# Patient Record
Sex: Male | Born: 1980
Health system: Southern US, Community
[De-identification: ages and names within clinical notes are randomized; demographics above are authoritative.]

---

## 2019-09-15 ENCOUNTER — Other Ambulatory Visit: Payer: Self-pay

## 2019-09-15 ENCOUNTER — Ambulatory Visit
Admission: EM | Admit: 2019-09-15 | Discharge: 2019-09-15 | Disposition: A | Discharge status: Home / Self Care | Payer: BC Managed Care – PPO | Attending: Family Medicine | Admitting: Family Medicine

## 2019-09-15 DIAGNOSIS — W503XXA Accidental bite by another person, initial encounter: Secondary | ICD-10-CM | POA: Diagnosis not present

## 2019-09-15 DIAGNOSIS — S61212A Laceration without foreign body of right middle finger without damage to nail, initial encounter: Secondary | ICD-10-CM

## 2019-09-15 DIAGNOSIS — S61259A Open bite of unspecified finger without damage to nail, initial encounter: Secondary | ICD-10-CM

## 2019-09-15 DIAGNOSIS — Z23 Encounter for immunization: Secondary | ICD-10-CM | POA: Diagnosis not present

## 2019-09-15 MED ORDER — TETANUS-DIPHTH-ACELL PERTUSSIS 5-2.5-18.5 LF-MCG/0.5 IM SUSP
0.5000 mL | Freq: Once | INTRAMUSCULAR | Status: AC
  Administered 2019-09-15: 13:00:00 0.5 mL via INTRAMUSCULAR

## 2019-09-15 MED ORDER — AMOXICILLIN-POT CLAVULANATE 875-125 MG PO TABS: 1.0000 | ORAL_TABLET | Freq: Two times a day (BID) | ORAL | 0 refills | Status: AC
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
## 2019-09-27 ENCOUNTER — Encounter: Payer: Self-pay | Admitting: Emergency Medicine

## 2019-09-27 ENCOUNTER — Ambulatory Visit
Admission: EM | Admit: 2019-09-27 | Discharge: 2019-09-27 | Disposition: A | Discharge status: Home / Self Care | Payer: BC Managed Care – PPO | Attending: Emergency Medicine | Admitting: Emergency Medicine

## 2019-09-27 ENCOUNTER — Other Ambulatory Visit: Payer: Self-pay

## 2019-09-27 DIAGNOSIS — W503XXD Accidental bite by another person, subsequent encounter: Secondary | ICD-10-CM

## 2019-09-27 DIAGNOSIS — S61259D Open bite of unspecified finger without damage to nail, subsequent encounter: Secondary | ICD-10-CM | POA: Diagnosis not present

## 2019-09-27 DIAGNOSIS — Z4800 Encounter for change or removal of nonsurgical wound dressing: Secondary | ICD-10-CM | POA: Diagnosis not present

## 2019-09-27 DIAGNOSIS — L03011 Cellulitis of right finger: Secondary | ICD-10-CM

## 2019-09-27 MED ORDER — DOXYCYCLINE HYCLATE 100 MG PO CAPS: 100.0000 mg | ORAL_CAPSULE | Freq: Two times a day (BID) | ORAL | 0 refills | Status: DC

## 2019-09-27 NOTE — ED Triage Notes (Signed)
Pt presents to UC for suture removal. He had 4 sutures placed on 09/15/19. Pt finger still has some swelling but no pain.
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
## 2019-09-27 NOTE — ED Notes (Signed)
Removed 3 sutures from pt wound. Carl Beavers, NP made aware that a 4th suture was not seen. Prompted to be seen by provider.

## 2019-09-27 NOTE — ED Notes (Signed)
Applied triple antibiotic ointment, non stick pad and coban to the left middle finger.

## 2019-09-27 NOTE — Discharge Instructions (Signed)
Take the doxycycline as directed.    Keep your wound clean and dry.  Wash it gently twice a day with soap and water.  Apply an antibiotic cream and bandage twice a day.    Return here if you see signs of worsening infection, such as increased pain, redness, pus-like drainage, warmth, fever, chills, or other concerning symptoms.    Follow up with the hand surgeon listed below in the next week for a wound recheck if your finger is not improving.  Call the number listed below to schedule an appointment.

## 2019-10-04 DIAGNOSIS — M40202 Unspecified kyphosis, cervical region: Secondary | ICD-10-CM | POA: Diagnosis not present

## 2019-10-04 DIAGNOSIS — M9902 Segmental and somatic dysfunction of thoracic region: Secondary | ICD-10-CM | POA: Diagnosis not present

## 2019-10-04 DIAGNOSIS — M9907 Segmental and somatic dysfunction of upper extremity: Secondary | ICD-10-CM | POA: Diagnosis not present

## 2019-10-04 DIAGNOSIS — M9901 Segmental and somatic dysfunction of cervical region: Secondary | ICD-10-CM | POA: Diagnosis not present

## 2019-10-11 DIAGNOSIS — M9907 Segmental and somatic dysfunction of upper extremity: Secondary | ICD-10-CM | POA: Diagnosis not present

## 2019-10-11 DIAGNOSIS — M9902 Segmental and somatic dysfunction of thoracic region: Secondary | ICD-10-CM | POA: Diagnosis not present

## 2019-10-11 DIAGNOSIS — M9901 Segmental and somatic dysfunction of cervical region: Secondary | ICD-10-CM | POA: Diagnosis not present

## 2019-10-11 DIAGNOSIS — M40202 Unspecified kyphosis, cervical region: Secondary | ICD-10-CM | POA: Diagnosis not present

## 2019-10-16 DIAGNOSIS — M9907 Segmental and somatic dysfunction of upper extremity: Secondary | ICD-10-CM | POA: Diagnosis not present

## 2019-10-16 DIAGNOSIS — M9901 Segmental and somatic dysfunction of cervical region: Secondary | ICD-10-CM | POA: Diagnosis not present

## 2019-10-16 DIAGNOSIS — M9902 Segmental and somatic dysfunction of thoracic region: Secondary | ICD-10-CM | POA: Diagnosis not present

## 2019-10-16 DIAGNOSIS — M40202 Unspecified kyphosis, cervical region: Secondary | ICD-10-CM | POA: Diagnosis not present

## 2019-10-18 DIAGNOSIS — M9901 Segmental and somatic dysfunction of cervical region: Secondary | ICD-10-CM | POA: Diagnosis not present

## 2019-10-18 DIAGNOSIS — M9907 Segmental and somatic dysfunction of upper extremity: Secondary | ICD-10-CM | POA: Diagnosis not present

## 2019-10-18 DIAGNOSIS — M9902 Segmental and somatic dysfunction of thoracic region: Secondary | ICD-10-CM | POA: Diagnosis not present

## 2019-10-18 DIAGNOSIS — M40202 Unspecified kyphosis, cervical region: Secondary | ICD-10-CM | POA: Diagnosis not present

## 2019-10-19 ENCOUNTER — Ambulatory Visit: Payer: BC Managed Care – PPO | Admitting: Plastic Surgery

## 2019-10-19 ENCOUNTER — Other Ambulatory Visit: Payer: Self-pay

## 2019-10-19 ENCOUNTER — Encounter: Payer: Self-pay | Admitting: Plastic Surgery

## 2019-10-19 VITALS — BP 140/82 | HR 59 | Temp 97.9°F | Ht 69.0 in | Wt 206.0 lb

## 2019-10-19 DIAGNOSIS — W503XXA Accidental bite by another person, initial encounter: Secondary | ICD-10-CM | POA: Diagnosis not present

## 2019-10-19 DIAGNOSIS — S61212A Laceration without foreign body of right middle finger without damage to nail, initial encounter: Secondary | ICD-10-CM | POA: Diagnosis not present
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
## 2019-10-25 DIAGNOSIS — M40202 Unspecified kyphosis, cervical region: Secondary | ICD-10-CM | POA: Diagnosis not present

## 2019-10-25 DIAGNOSIS — M9902 Segmental and somatic dysfunction of thoracic region: Secondary | ICD-10-CM | POA: Diagnosis not present

## 2019-10-25 DIAGNOSIS — M9901 Segmental and somatic dysfunction of cervical region: Secondary | ICD-10-CM | POA: Diagnosis not present

## 2019-10-25 DIAGNOSIS — M9907 Segmental and somatic dysfunction of upper extremity: Secondary | ICD-10-CM | POA: Diagnosis not present

## 2019-10-26 DIAGNOSIS — M40202 Unspecified kyphosis, cervical region: Secondary | ICD-10-CM | POA: Diagnosis not present

## 2019-10-26 DIAGNOSIS — M9902 Segmental and somatic dysfunction of thoracic region: Secondary | ICD-10-CM | POA: Diagnosis not present

## 2019-10-26 DIAGNOSIS — M9907 Segmental and somatic dysfunction of upper extremity: Secondary | ICD-10-CM | POA: Diagnosis not present

## 2019-10-26 DIAGNOSIS — M9901 Segmental and somatic dysfunction of cervical region: Secondary | ICD-10-CM | POA: Diagnosis not present

## 2019-10-30 DIAGNOSIS — M40202 Unspecified kyphosis, cervical region: Secondary | ICD-10-CM | POA: Diagnosis not present

## 2019-10-30 DIAGNOSIS — M9902 Segmental and somatic dysfunction of thoracic region: Secondary | ICD-10-CM | POA: Diagnosis not present

## 2019-10-30 DIAGNOSIS — M9907 Segmental and somatic dysfunction of upper extremity: Secondary | ICD-10-CM | POA: Diagnosis not present

## 2019-10-30 DIAGNOSIS — M9901 Segmental and somatic dysfunction of cervical region: Secondary | ICD-10-CM | POA: Diagnosis not present

## 2019-11-01 DIAGNOSIS — M9902 Segmental and somatic dysfunction of thoracic region: Secondary | ICD-10-CM | POA: Diagnosis not present

## 2019-11-01 DIAGNOSIS — M9901 Segmental and somatic dysfunction of cervical region: Secondary | ICD-10-CM | POA: Diagnosis not present

## 2019-11-01 DIAGNOSIS — M40202 Unspecified kyphosis, cervical region: Secondary | ICD-10-CM | POA: Diagnosis not present

## 2019-11-01 DIAGNOSIS — M9907 Segmental and somatic dysfunction of upper extremity: Secondary | ICD-10-CM | POA: Diagnosis not present

## 2019-11-02 ENCOUNTER — Ambulatory Visit: Payer: BC Managed Care – PPO | Admitting: Plastic Surgery

## 2019-11-02 DIAGNOSIS — M9907 Segmental and somatic dysfunction of upper extremity: Secondary | ICD-10-CM | POA: Diagnosis not present

## 2019-11-02 DIAGNOSIS — M9901 Segmental and somatic dysfunction of cervical region: Secondary | ICD-10-CM | POA: Diagnosis not present

## 2019-11-02 DIAGNOSIS — M9902 Segmental and somatic dysfunction of thoracic region: Secondary | ICD-10-CM | POA: Diagnosis not present

## 2019-11-02 DIAGNOSIS — M40202 Unspecified kyphosis, cervical region: Secondary | ICD-10-CM | POA: Diagnosis not present

## 2019-11-06 DIAGNOSIS — M9901 Segmental and somatic dysfunction of cervical region: Secondary | ICD-10-CM | POA: Diagnosis not present

## 2019-11-06 DIAGNOSIS — M40202 Unspecified kyphosis, cervical region: Secondary | ICD-10-CM | POA: Diagnosis not present

## 2019-11-06 DIAGNOSIS — M9907 Segmental and somatic dysfunction of upper extremity: Secondary | ICD-10-CM | POA: Diagnosis not present

## 2019-11-06 DIAGNOSIS — M9902 Segmental and somatic dysfunction of thoracic region: Secondary | ICD-10-CM | POA: Diagnosis not present

## 2019-11-09 DIAGNOSIS — M40202 Unspecified kyphosis, cervical region: Secondary | ICD-10-CM | POA: Diagnosis not present

## 2019-11-09 DIAGNOSIS — M9907 Segmental and somatic dysfunction of upper extremity: Secondary | ICD-10-CM | POA: Diagnosis not present

## 2019-11-09 DIAGNOSIS — M9902 Segmental and somatic dysfunction of thoracic region: Secondary | ICD-10-CM | POA: Diagnosis not present

## 2019-11-09 DIAGNOSIS — M9901 Segmental and somatic dysfunction of cervical region: Secondary | ICD-10-CM | POA: Diagnosis not present

## 2019-11-13 DIAGNOSIS — M9907 Segmental and somatic dysfunction of upper extremity: Secondary | ICD-10-CM | POA: Diagnosis not present

## 2019-11-13 DIAGNOSIS — M40202 Unspecified kyphosis, cervical region: Secondary | ICD-10-CM | POA: Diagnosis not present

## 2019-11-13 DIAGNOSIS — M9901 Segmental and somatic dysfunction of cervical region: Secondary | ICD-10-CM | POA: Diagnosis not present

## 2019-11-13 DIAGNOSIS — M9902 Segmental and somatic dysfunction of thoracic region: Secondary | ICD-10-CM | POA: Diagnosis not present

## 2019-11-16 DIAGNOSIS — M9907 Segmental and somatic dysfunction of upper extremity: Secondary | ICD-10-CM | POA: Diagnosis not present

## 2019-11-16 DIAGNOSIS — M9901 Segmental and somatic dysfunction of cervical region: Secondary | ICD-10-CM | POA: Diagnosis not present

## 2019-11-16 DIAGNOSIS — M40202 Unspecified kyphosis, cervical region: Secondary | ICD-10-CM | POA: Diagnosis not present

## 2019-11-16 DIAGNOSIS — M9902 Segmental and somatic dysfunction of thoracic region: Secondary | ICD-10-CM | POA: Diagnosis not present

## 2019-11-23 DIAGNOSIS — M9901 Segmental and somatic dysfunction of cervical region: Secondary | ICD-10-CM | POA: Diagnosis not present

## 2019-11-23 DIAGNOSIS — M40202 Unspecified kyphosis, cervical region: Secondary | ICD-10-CM | POA: Diagnosis not present

## 2019-11-23 DIAGNOSIS — M9907 Segmental and somatic dysfunction of upper extremity: Secondary | ICD-10-CM | POA: Diagnosis not present

## 2019-11-23 DIAGNOSIS — M9902 Segmental and somatic dysfunction of thoracic region: Secondary | ICD-10-CM | POA: Diagnosis not present

## 2019-11-27 DIAGNOSIS — M40202 Unspecified kyphosis, cervical region: Secondary | ICD-10-CM | POA: Diagnosis not present

## 2019-11-27 DIAGNOSIS — M9902 Segmental and somatic dysfunction of thoracic region: Secondary | ICD-10-CM | POA: Diagnosis not present

## 2019-11-27 DIAGNOSIS — M9907 Segmental and somatic dysfunction of upper extremity: Secondary | ICD-10-CM | POA: Diagnosis not present

## 2019-11-27 DIAGNOSIS — M9901 Segmental and somatic dysfunction of cervical region: Secondary | ICD-10-CM | POA: Diagnosis not present

## 2019-11-29 DIAGNOSIS — M9907 Segmental and somatic dysfunction of upper extremity: Secondary | ICD-10-CM | POA: Diagnosis not present

## 2019-11-29 DIAGNOSIS — M9902 Segmental and somatic dysfunction of thoracic region: Secondary | ICD-10-CM | POA: Diagnosis not present

## 2019-11-29 DIAGNOSIS — M40202 Unspecified kyphosis, cervical region: Secondary | ICD-10-CM | POA: Diagnosis not present

## 2019-11-29 DIAGNOSIS — M9901 Segmental and somatic dysfunction of cervical region: Secondary | ICD-10-CM | POA: Diagnosis not present

## 2019-11-30 DIAGNOSIS — M40202 Unspecified kyphosis, cervical region: Secondary | ICD-10-CM | POA: Diagnosis not present

## 2019-11-30 DIAGNOSIS — M9902 Segmental and somatic dysfunction of thoracic region: Secondary | ICD-10-CM | POA: Diagnosis not present

## 2019-11-30 DIAGNOSIS — M9907 Segmental and somatic dysfunction of upper extremity: Secondary | ICD-10-CM | POA: Diagnosis not present

## 2019-11-30 DIAGNOSIS — M9901 Segmental and somatic dysfunction of cervical region: Secondary | ICD-10-CM | POA: Diagnosis not present

## 2019-12-04 DIAGNOSIS — M9902 Segmental and somatic dysfunction of thoracic region: Secondary | ICD-10-CM | POA: Diagnosis not present

## 2019-12-04 DIAGNOSIS — M9901 Segmental and somatic dysfunction of cervical region: Secondary | ICD-10-CM | POA: Diagnosis not present

## 2019-12-04 DIAGNOSIS — M9907 Segmental and somatic dysfunction of upper extremity: Secondary | ICD-10-CM | POA: Diagnosis not present

## 2019-12-04 DIAGNOSIS — M40202 Unspecified kyphosis, cervical region: Secondary | ICD-10-CM | POA: Diagnosis not present

## 2019-12-07 DIAGNOSIS — M9902 Segmental and somatic dysfunction of thoracic region: Secondary | ICD-10-CM | POA: Diagnosis not present

## 2019-12-07 DIAGNOSIS — M40202 Unspecified kyphosis, cervical region: Secondary | ICD-10-CM | POA: Diagnosis not present

## 2019-12-07 DIAGNOSIS — M9901 Segmental and somatic dysfunction of cervical region: Secondary | ICD-10-CM | POA: Diagnosis not present

## 2019-12-07 DIAGNOSIS — M9907 Segmental and somatic dysfunction of upper extremity: Secondary | ICD-10-CM | POA: Diagnosis not present

## 2019-12-21 DIAGNOSIS — M9902 Segmental and somatic dysfunction of thoracic region: Secondary | ICD-10-CM | POA: Diagnosis not present

## 2019-12-21 DIAGNOSIS — M9907 Segmental and somatic dysfunction of upper extremity: Secondary | ICD-10-CM | POA: Diagnosis not present

## 2019-12-21 DIAGNOSIS — M40202 Unspecified kyphosis, cervical region: Secondary | ICD-10-CM | POA: Diagnosis not present

## 2019-12-21 DIAGNOSIS — M9901 Segmental and somatic dysfunction of cervical region: Secondary | ICD-10-CM | POA: Diagnosis not present

## 2020-03-11 ENCOUNTER — Encounter: Payer: Self-pay | Admitting: Emergency Medicine

## 2020-03-11 ENCOUNTER — Ambulatory Visit
Admission: EM | Admit: 2020-03-11 | Discharge: 2020-03-11 | Disposition: A | Discharge status: Home / Self Care | Payer: Worker's Compensation | Attending: Family Medicine | Admitting: Family Medicine

## 2020-03-11 ENCOUNTER — Other Ambulatory Visit: Payer: Self-pay

## 2020-03-11 DIAGNOSIS — S81812A Laceration without foreign body, left lower leg, initial encounter: Secondary | ICD-10-CM

## 2020-03-11 NOTE — ED Triage Notes (Signed)
Pt presents to [facility] for assessment of laceration to shin of left leg, occurring this evening at work, states he walked into a corner of a machine.

## 2020-03-11 NOTE — Discharge Instructions (Addendum)
Return in 10-14 days for removal Watch for signs of infection Keep clean and dry Antibiotic ointment for a few days and then to air  Follow up as needed for continued or worsening symptoms

## 2020-03-12 DIAGNOSIS — S81812A Laceration without foreign body, left lower leg, initial encounter: Secondary | ICD-10-CM

## 2020-03-12 NOTE — ED Provider Notes (Addendum)
[facility]    CSN: [id number] Arrival date & time: 03/11/20  1948      History   Chief Complaint Chief Complaint  Patient presents with  . Laceration    HPI Carl Berg is a 39 y.o. male.   Patient is a 39 year old male presents today for laceration to left lower extremity.  Located to shin.  This occurred this evening prior to arrival at work.  Reporting he walked into the corner of a metal machine.  Bleeding is controlled.     History reviewed. No pertinent past medical history.  There are no problems to display for this patient.   History reviewed. No pertinent surgical history.     Home Medications    Prior to Admission medications   Not on File    Family History History reviewed. No pertinent family history.  Social History Social History   Tobacco Use  . Smoking status: Never Smoker  . Smokeless tobacco: Never Used  Vaping Use  . Vaping Use: Never used  Substance Use Topics  . Alcohol use: Yes    Comment: occasionally  . Drug use: Never     Allergies   Patient has no known allergies.   Review of Systems Review of Systems   Physical Exam Triage Vital Signs ED Triage Vitals [03/11/20 1959]  Enc Vitals Group     BP      Pulse      Resp      Temp 98.5 F (36.9 C)     Temp Source Oral     SpO2      Weight      Height      Head Circumference      Peak Flow      Pain Score 0     Pain Loc      Pain Edu?      Excl. in [place]?    No data found.  Updated Vital Signs Temp 98.5 F (36.9 C) (Oral)   Visual Acuity Right Eye Distance:   Left Eye Distance:   Bilateral Distance:    Right Eye Near:   Left Eye Near:    Bilateral Near:     Physical Exam Vitals and nursing note reviewed.  Constitutional:      Appearance: Normal appearance.  HENT:     Head: Normocephalic and atraumatic.     Nose: Nose normal.  Eyes:     Conjunctiva/sclera: Conjunctivae normal.  Pulmonary:     Effort: Pulmonary effort is normal.   Musculoskeletal:        General: Normal range of motion.     Cervical back: Normal range of motion.  Skin:    General: Skin is warm and dry.          Comments: Approximated 5 cm laceration to left lower extremity, bleeding controlled.  Neurological:     Mental Status: He is alert.  Psychiatric:        Mood and Affect: Mood normal.      UC Treatments / Results  Labs (all labs ordered are listed, but only abnormal results are displayed) Labs Reviewed - No data to display  EKG   Radiology No results found.  Procedures Laceration Repair  Date/Time: 03/12/2020 10:23 AM Performed by: Janace Aris, NP Authorized by: Janace Aris, NP   Consent:    Consent obtained:  Verbal   Consent given by:  Patient   Risks discussed:  Infection, need for additional repair, pain, poor  cosmetic result and poor wound healing   Alternatives discussed:  No treatment and delayed treatment Universal protocol:    Patient identity confirmed:  Verbally with patient Anesthesia (see MAR for exact dosages):    Anesthesia method:  Local infiltration   Local anesthetic:  Lidocaine 2% WITH epi Laceration details:    Location:  Leg   Leg location:  L lower leg   Length (cm):  5 Repair type:    Repair type:  Intermediate Exploration:    Hemostasis achieved with:  Direct pressure   Wound extent: no foreign bodies/material noted, no muscle damage noted, no nerve damage noted, no tendon damage noted and no underlying fracture noted     Contaminated: no   Treatment:    Area cleansed with:  Saline and soap and water   Amount of cleaning:  Standard   Irrigation method:  Pressure wash   Visualized foreign bodies/material removed: no   Skin repair:    Repair method:  Sutures   Suture size:  3-0   Suture material:  Prolene   Suture technique:  Simple interrupted   Number of sutures:  7 Approximation:    Approximation:  Close Post-procedure details:    Dressing:  Antibiotic ointment and bulky  dressing   Patient tolerance of procedure:  Tolerated well, no immediate complications   (including critical care time)  Medications Ordered in UC Medications - No data to display  Initial Impression / Assessment and Plan / UC Course  I have reviewed the triage vital signs and the nursing notes.  Pertinent labs & imaging results that were available during my care of the patient were reviewed by me and considered in my medical decision making (see chart for details).     Laceration to left lower leg Sutured here in clinic Patient's tetanus is already updated.  No need to reupdate. Placed antibiotic ointment and wrapped Recommended return in 10 to 14 days for removal. Recommended watch for signs of infection and keep clean and dry. Follow up as needed for continued or worsening symptoms  Final Clinical Impressions(s) / UC Diagnoses   Final diagnoses:  Laceration of left lower leg, initial encounter     Discharge Instructions     Return in 10-14 days for removal Watch for signs of infection Keep clean and dry Antibiotic ointment for a few days and then to air  Follow up as needed for continued or worsening symptoms     ED Prescriptions    None     PDMP not reviewed this encounter.   Janace Aris, NP 03/12/20 1027    Dahlia Byes A, NP 03/12/20 1029

## 2020-05-08 ENCOUNTER — Other Ambulatory Visit: Payer: Self-pay

## 2020-05-08 ENCOUNTER — Ambulatory Visit: Payer: BC Managed Care – PPO | Admitting: Physician Assistant

## 2020-05-08 VITALS — BP 138/74 | HR 84 | Temp 100.0°F | Resp 18 | Ht 69.0 in | Wt 200.0 lb

## 2020-05-08 DIAGNOSIS — Z20822 Contact with and (suspected) exposure to covid-19: Secondary | ICD-10-CM

## 2020-05-08 LAB — POC COVID19 BINAXNOW: SARS Coronavirus 2 Ag: NEGATIVE

## 2020-05-08 NOTE — Progress Notes (Signed)
New Patient Office Visit  Subjective:  Patient ID: Carl Berg, male    DOB: Sep 30, 1980  Age: 39 y.o. MRN: [id number]  CC:  Chief Complaint  Patient presents with  . Covid Exposure    HPI Carl Berg states that he started feeling poorly last night, becoming worse today states that he has not measured his temperature but has felt hot on and off.  Endorses chills, body aches, fatigue, sore throat. Has not tried anything for relief.  No sick contacts.  Endorses [employer] Covid vaccine in March 2021.  History reviewed. No pertinent past medical history.  History reviewed. No pertinent surgical history.  History reviewed. No pertinent family history.  Social History   Socioeconomic History  . Marital status: Single    Spouse name: Not on file  . Number of children: Not on file  . Years of education: Not on file  . Highest education level: Not on file  Occupational History  . Not on file  Tobacco Use  . Smoking status: Never Smoker  . Smokeless tobacco: Never Used  Vaping Use  . Vaping Use: Never used  Substance and Sexual Activity  . Alcohol use: Yes    Comment: occasionally  . Drug use: Never  . Sexual activity: Not on file  Other Topics Concern  . Not on file  Social History Narrative  . Not on file   Social Determinants of Health   Financial Resource Strain:   . Difficulty of Paying Living Expenses: Not on file  Food Insecurity:   . Worried About Programme researcher, broadcasting/film/video in the Last Year: Not on file  . Ran Out of Food in the Last Year: Not on file  Transportation Needs:   . Lack of Transportation (Medical): Not on file  . Lack of Transportation (Non-Medical): Not on file  Physical Activity:   . Days of Exercise per Week: Not on file  . Minutes of Exercise per Session: Not on file  Stress:   . Feeling of Stress : Not on file  Social Connections:   . Frequency of Communication with Friends and Family: Not on file  . Frequency of Social  Gatherings with Friends and Family: Not on file  . Attends Religious Services: Not on file  . Active Member of Clubs or Organizations: Not on file  . Attends Banker Meetings: Not on file  . Marital Status: Not on file  Intimate Partner Violence:   . Fear of Current or Ex-Partner: Not on file  . Emotionally Abused: Not on file  . Physically Abused: Not on file  . Sexually Abused: Not on file    ROS Review of Systems  Constitutional: Positive for chills, fatigue and fever.  HENT: Positive for sore throat. Negative for congestion, ear pain, postnasal drip, rhinorrhea and sinus pressure.   Respiratory: Negative for cough, shortness of breath and wheezing.   Cardiovascular: Negative for chest pain.  Gastrointestinal: Negative for diarrhea, nausea and vomiting.  Endocrine: Negative.   Genitourinary: Negative.   Musculoskeletal: Positive for myalgias.  Skin: Negative.   Allergic/Immunologic: Negative.   Neurological: Positive for headaches.  Hematological: Negative.   Psychiatric/Behavioral: Negative.     Objective:   Today's Vitals: BP 138/74 (BP Location: Left Arm, Patient Position: Sitting, Cuff Size: Large)   Pulse 84   Temp 100 F (37.8 C) (Oral)   Resp 18   Ht 5\' 9"  (1.753 m)   Wt 200 lb (90.7 kg)   SpO2 100%  BMI 29.53 kg/m   Physical Exam Vitals and nursing note reviewed.  Constitutional:      General: He is not in acute distress.    Appearance: Normal appearance. He is normal weight. He is not ill-appearing.  HENT:     Head: Normocephalic and atraumatic.     Right Ear: Tympanic membrane, ear canal and external ear normal.     Left Ear: Tympanic membrane, ear canal and external ear normal.     Nose: Nose normal. No congestion or rhinorrhea.     Mouth/Throat:     Mouth: Mucous membranes are moist.     Pharynx: Oropharynx is clear.  Eyes:     Extraocular Movements: Extraocular movements intact.     Conjunctiva/sclera: Conjunctivae normal.      Pupils: Pupils are equal, round, and reactive to light.  Cardiovascular:     Rate and Rhythm: Normal rate and regular rhythm.     Pulses: Normal pulses.     Heart sounds: Normal heart sounds.  Pulmonary:     Effort: Pulmonary effort is normal.     Breath sounds: Normal breath sounds. No wheezing.  Abdominal:     General: Abdomen is flat.     Palpations: Abdomen is soft.  Musculoskeletal:        General: Normal range of motion.     Cervical back: Normal range of motion and neck supple.  Lymphadenopathy:     Cervical: No cervical adenopathy.  Skin:    General: Skin is warm and dry.  Neurological:     General: No focal deficit present.     Mental Status: He is alert and oriented to person, place, and time.  Psychiatric:        Mood and Affect: Mood normal.        Behavior: Behavior normal.        Thought Content: Thought content normal.        Judgment: Judgment normal.     Assessment & Plan:   Problem List Items Addressed This Visit    None    Visit Diagnoses    Suspected 2019 novel coronavirus infection    -  Primary   Relevant Orders   POC COVID-19 (Completed)   Novel Coronavirus, NAA ([facility])      No outpatient encounter medications on file as of 05/08/2020.   No facility-administered encounter medications on file as of 05/08/2020.  1. Suspected 2019 novel coronavirus infection  Rapid testing negative. Patient evaluated, tested and sent home with instructions for home care and Quarantine. Instructed to seek further care if symptoms worsen.  Is set up with follow up for a virtual visit with PCP in next 24-48 hours.   - POC COVID-19 - Novel Coronavirus, NAA ([facility])   I have reviewed the patient's medical history (PMH, PSH, Social History, Family History, Medications, and allergies) , and have been updated if relevant. I spent 30 minutes reviewing chart and  face to face time with patient.    Follow-up: Return if symptoms worsen or fail to improve.   Kasandra Knudsen  Mayers, PA-C

## 2020-05-08 NOTE — Patient Instructions (Signed)
Please make sure to login to MyChart so you can see your test results as soon as they are ready.  Get lots of rest and plenty of hydration.  You can use over the counter cold and pain relief products for your symptoms.  I hope that you feel better soon  Roney Jaffe, PA-C Physician Assistant [facility] [email or web address]   COVID-19: Quarantine vs. Isolation QUARANTINE keeps someone who was in close contact with someone who has COVID-19 away from others. If you had close contact with a person who has COVID-19  Stay home until 14 days after your last contact.  Check your temperature twice a day and watch for symptoms of COVID-19.  If possible, stay away from people who are at higher-risk for getting very sick from COVID-19. ISOLATION keeps someone who is sick or tested positive for COVID-19 without symptoms away from others, even in their own home. If you are sick and think or know you have COVID-19  Stay home until after ? At least 10 days since symptoms first appeared and ? At least 24 hours with no fever without fever-reducing medication and ? Symptoms have improved If you tested positive for COVID-19 but do not have symptoms  Stay home until after ? 10 days have passed since your positive test If you live with others, stay in a specific "sick room" or area and away from other people or animals, including pets. Use a separate bathroom, if available. [email or web address] 03/06/2019 This information is not intended to replace advice given to you by your health care provider. Make sure you discuss any questions you have with your health care provider. Document Revised: 07/20/2019 Document Reviewed: 07/20/2019 Elsevier Patient Education  2020 Elsevier Inc.  COVID-19: What Your Test Results Mean If you test positive for COVID-19 Take steps to help prevent the spread of COVID-19 Stay home.  Do not leave your home, except  to get medical care. Do not visit public areas. Get rest and stay hydrated. Take over-the-counter medicines, such as acetaminophen, to help you feel better. Stay in touch with your doctor. Separate yourself from other people.  As much as possible, stay in a specific room and away from other people and pets in your home. If you test negative for COVID-19  You probably were not infected at the time your sample was collected.  However, that does not mean you will not get sick.  It is possible that you were very early in your infection when your sample was collected and that you could test positive later. A negative test result does not mean you won't get sick later. [email or web address] 01/14/2019 This information is not intended to replace advice given to you by your health care provider. Make sure you discuss any questions you have with your health care provider. Document Revised: 07/20/2019 Document Reviewed: 07/20/2019 Elsevier Patient Education  2020 [employer].

## 2020-05-08 NOTE — Progress Notes (Signed)
Patient complains of URI symptoms beginning today. Patient denies any known exposure to COVID. Patient has been vaccinated with J&J in march. Patient has eaten today. Patient denies pain at this time.

## 2020-05-10 ENCOUNTER — Encounter: Payer: Self-pay | Admitting: Physician Assistant

## 2020-05-10 LAB — NOVEL CORONAVIRUS, NAA: SARS-CoV-2, NAA: DETECTED — AB

## 2020-05-10 LAB — SARS-COV-2, NAA 2 DAY TAT

## 2020-05-17 DIAGNOSIS — Z20828 Contact with and (suspected) exposure to other viral communicable diseases: Secondary | ICD-10-CM | POA: Diagnosis not present

## 2020-07-18 DIAGNOSIS — M9902 Segmental and somatic dysfunction of thoracic region: Secondary | ICD-10-CM | POA: Diagnosis not present

## 2020-07-18 DIAGNOSIS — M9901 Segmental and somatic dysfunction of cervical region: Secondary | ICD-10-CM | POA: Diagnosis not present

## 2020-07-18 DIAGNOSIS — M40202 Unspecified kyphosis, cervical region: Secondary | ICD-10-CM | POA: Diagnosis not present

## 2020-07-18 DIAGNOSIS — M9907 Segmental and somatic dysfunction of upper extremity: Secondary | ICD-10-CM | POA: Diagnosis not present

## 2020-08-21 ENCOUNTER — Ambulatory Visit: Payer: BC Managed Care – PPO | Admitting: Internal Medicine

## 2020-08-29 ENCOUNTER — Encounter: Payer: Self-pay | Admitting: Internal Medicine

## 2020-08-29 ENCOUNTER — Other Ambulatory Visit: Payer: Self-pay

## 2020-08-29 ENCOUNTER — Telehealth (INDEPENDENT_AMBULATORY_CARE_PROVIDER_SITE_OTHER): Payer: BC Managed Care – PPO | Admitting: Internal Medicine

## 2020-08-29 VITALS — Ht 69.0 in | Wt 204.0 lb

## 2020-08-29 DIAGNOSIS — Z7689 Persons encountering health services in other specified circumstances: Secondary | ICD-10-CM | POA: Diagnosis not present

## 2020-08-29 DIAGNOSIS — Z7189 Other specified counseling: Secondary | ICD-10-CM | POA: Diagnosis not present

## 2020-08-29 NOTE — Progress Notes (Signed)
Virtual Visit via Telephone Note  I connected with Carl Berg, on 08/29/2020 at 9:38 AM by telephone due to the COVID-19 pandemic and verified that I am speaking with the correct person using two identifiers.   Consent: I discussed the limitations, risks, security and privacy concerns of performing an evaluation and management service by telephone and the availability of in person appointments. I also discussed with the patient that there may be a patient responsible charge related to this service. The patient expressed understanding and agreed to proceed.   Location of Patient: Home   Location of Provider: Clinic    Persons participating in Telemedicine visit: Darly Farah Benish Dr. Earlene Plater      History of Present Illness: Patient has a visit to establish care. Has been several years since saw a primary care physician. No PMH. No surgical history. Not currently taking any medications.   Received J&J COVID vaccination in March. Has not yet received booster. Reports never gets flu vaccine.   No past medical history on file. No Known Allergies  No current outpatient medications on file prior to visit.   No current facility-administered medications on file prior to visit.    Observations/Objective: NAD. Speaking clearly.  Work of breathing normal.  Alert and oriented. Mood appropriate.   Assessment and Plan: 1. Encounter to establish care Reviewed patient's PMH, social history, surgical history, and medications.  Is overdue for annual exam, screening blood work, and health maintenance topics. Have asked patient to return for visit to address these items.   2. Counseled about COVID-19 virus infection Encouraged patient to receive booster vaccine as is eligible. Discussed that seeing higher rates of infection amongst those who have not been boosted and more severe illness. Continue to adhere to the 3W's.     Follow Up Instructions: Annual exam    I  discussed the assessment and treatment plan with the patient. The patient was provided an opportunity to ask questions and all were answered. The patient agreed with the plan and demonstrated an understanding of the instructions.   The patient was advised to call back or seek an in-person evaluation if the symptoms worsen or if the condition fails to improve as anticipated.     I provided 7 minutes total of non-face-to-face time during this encounter including median intraservice time, reviewing previous notes, investigations, ordering medications, medical decision making, coordinating care and patient verbalized understanding at the end of the visit.    Marcy Siren, D.O. Primary Care at [facility]  08/29/2020, 9:38 AM

## 2020-09-16 ENCOUNTER — Telehealth: Payer: Self-pay | Admitting: *Deleted

## 2020-09-16 NOTE — Telephone Encounter (Signed)
"  I'm calling to ask some questions about your services."   I'm returning your call.  How can I help you?  "Do you all treat achilles?"  Yes, we treat problems with Achilles.  "Can I make an appointment?"  We can get you in tomorrow with Dr. Logan Bores.  "That will be great."

## 2020-09-17 ENCOUNTER — Ambulatory Visit: Payer: BC Managed Care – PPO | Admitting: Podiatry

## 2020-09-17 ENCOUNTER — Ambulatory Visit (INDEPENDENT_AMBULATORY_CARE_PROVIDER_SITE_OTHER): Payer: BC Managed Care – PPO

## 2020-09-17 ENCOUNTER — Encounter (INDEPENDENT_AMBULATORY_CARE_PROVIDER_SITE_OTHER): Payer: Self-pay

## 2020-09-17 ENCOUNTER — Other Ambulatory Visit: Payer: Self-pay

## 2020-09-17 DIAGNOSIS — M7661 Achilles tendinitis, right leg: Secondary | ICD-10-CM

## 2020-09-17 DIAGNOSIS — M7660 Achilles tendinitis, unspecified leg: Secondary | ICD-10-CM

## 2020-09-17 DIAGNOSIS — S86011A Strain of right Achilles tendon, initial encounter: Secondary | ICD-10-CM

## 2020-09-17 NOTE — Progress Notes (Signed)
   HPI: 40 y.o. male presenting today as a new patient for evaluation of pain and tenderness to the Achilles tendon of the right lower extremity.  Patient states that he was playing basketball approximately 6-7 weeks ago when he took a misstep and heard 2 distinct popping noises to the posterior ankle.  He has had pain and tenderness ever since.  He states that currently it is more of a nagging pain.  He has not done anything for treatment.  He presents for further treatment evaluation  No past medical history on file.   Physical Exam: General: The patient is alert and oriented x3 in no acute distress.  Dermatology: Skin is warm, dry and supple bilateral lower extremities. Negative for open lesions or macerations.  Vascular: Palpable pedal pulses bilaterally. No edema or erythema noted. Capillary refill within normal limits.  Neurological: Epicritic and protective threshold grossly intact bilaterally.   Musculoskeletal Exam: Range of motion within normal limits to all pedal and ankle joints bilateral. Muscle strength 5/5 in all groups bilateral.  Pain on palpation along the Achilles tendon right lower extremity.  There is also pain with forced plantar flexion against resistance.  Radiographic Exam:  Normal osseous mineralization. Joint spaces preserved. No fracture/dislocation/boney destruction.    Assessment: 1.  Achilles tendon injury right 2.  Achilles tendinitis right   Plan of Care:  1. Patient evaluated. X-Rays reviewed.  2.  Today were going to order an MRI right ankle to rule out Achilles tendon tear.  Patient does have a known history of trauma while playing basketball and heard an audible pop with significant pain to the area. 3.  Cam boot dispensed.  Weightbearing as tolerated 4.  Patient declined any oral anti-inflammatory  5.  Return to clinic after MRI to review results  *Works at [employer], Teacher, adult education for cars, furniture, etc      Felecia Shelling,  DPM [facility]  Dr. Felecia Shelling, DPM    [address] [place], [place] [address]                Office [phone]  Fax [phone]

## 2020-09-27 ENCOUNTER — Other Ambulatory Visit: Payer: Self-pay

## 2020-09-27 ENCOUNTER — Ambulatory Visit
Admission: RE | Admit: 2020-09-27 | Discharge: 2020-09-27 | Disposition: A | Discharge status: Home / Self Care | Payer: BC Managed Care – PPO | Source: Ambulatory Visit | Attending: Podiatry | Admitting: Podiatry

## 2020-09-27 DIAGNOSIS — S99911A Unspecified injury of right ankle, initial encounter: Secondary | ICD-10-CM | POA: Diagnosis not present

## 2020-09-27 DIAGNOSIS — M7660 Achilles tendinitis, unspecified leg: Secondary | ICD-10-CM

## 2020-09-27 DIAGNOSIS — S86011A Strain of right Achilles tendon, initial encounter: Secondary | ICD-10-CM

## 2020-09-27 IMAGING — MR MR ANKLE*R* W/O CM
5 series · 40 of 40 positions shown · non-contrast
Comparison: Right foot x-rays dated [DATE].

CLINICAL DATA: Right ankle injury playing basketball 2 weeks ago.

EXAM:
MRI OF THE RIGHT ANKLE WITHOUT CONTRAST
TECHNIQUE: Multiplanar, multisequence MR imaging of the ankle was performed. No
intravenous contrast was administered.

[Series 4: T1 · axial · 3.0mm · 0.66mm/px · z∈[-103,+49]mm · 10 of 40 slices shown (1 of 2)]
[im 1/40]
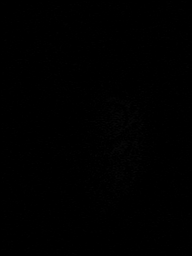
[im 5/40]
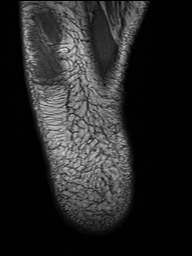
[im 9/40]
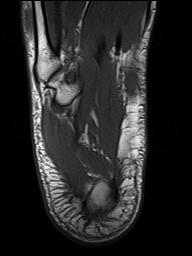
[im 14/40]
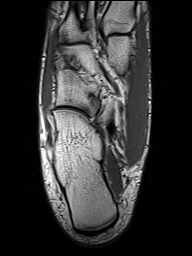
[im 18/40]
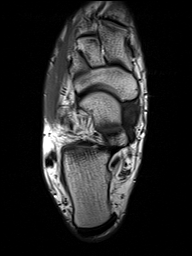
[im 22/40]
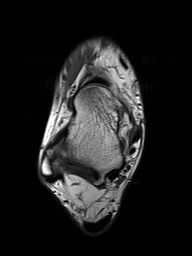
[im 27/40]
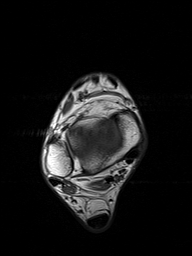
[im 31/40]
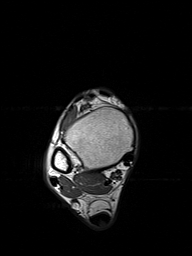
[im 35/40]
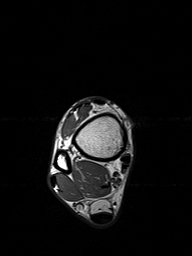
[im 40/40]
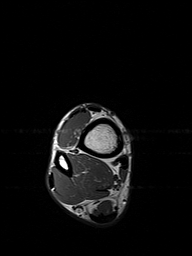

[Series 5: T2 fat-sat · axial · 3.0mm · 0.66mm/px · z∈[-104,+48]mm · 10 of 40 slices shown (1 of 2)]
[im 1/40]
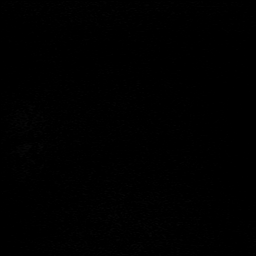
[im 5/40]
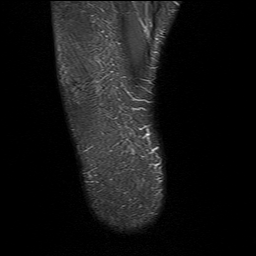
[im 9/40]
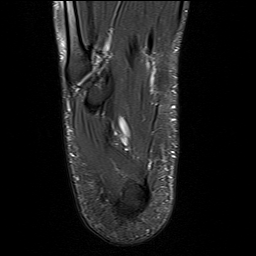
[im 14/40]
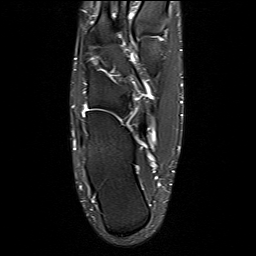
[im 18/40]
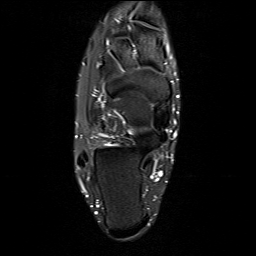
[im 22/40]
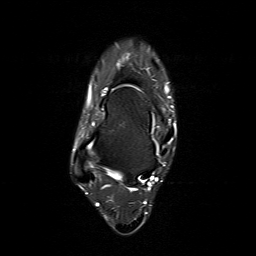
[im 27/40]
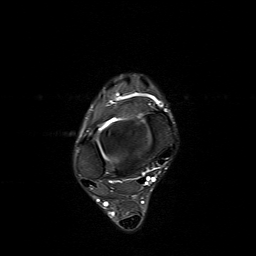
[im 31/40]
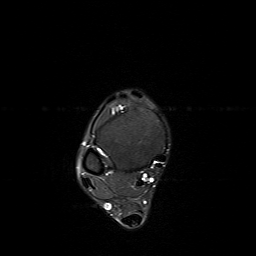
[im 35/40]
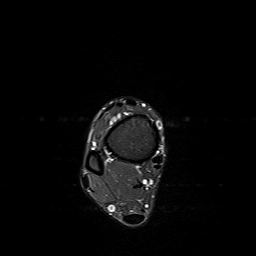
[im 40/40]
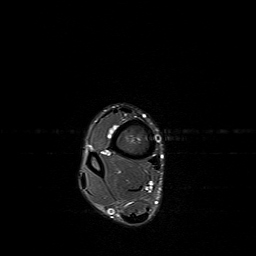

[Series 6: T1 · sagittal · 4.0mm · 0.70mm/px · 5 of 23 slices shown (2 of 2)]
[im 1/23]
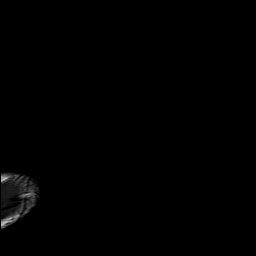
[im 6/23]
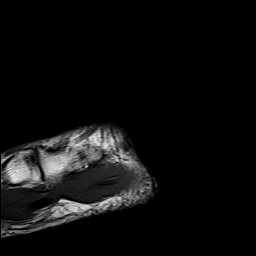
[im 12/23]
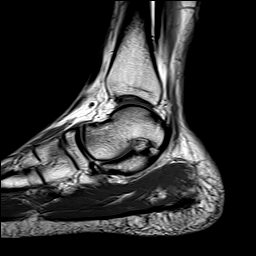
[im 17/23]
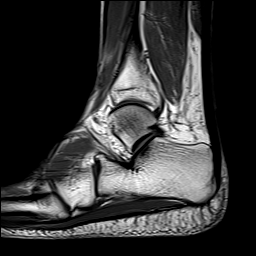
[im 23/23]
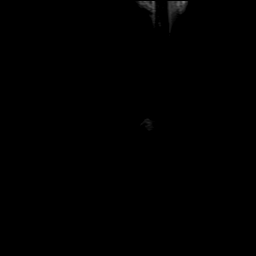

[Series 7: STIR · sagittal · 4.0mm · 0.35mm/px · 5 of 23 slices shown]
[im 1/23]
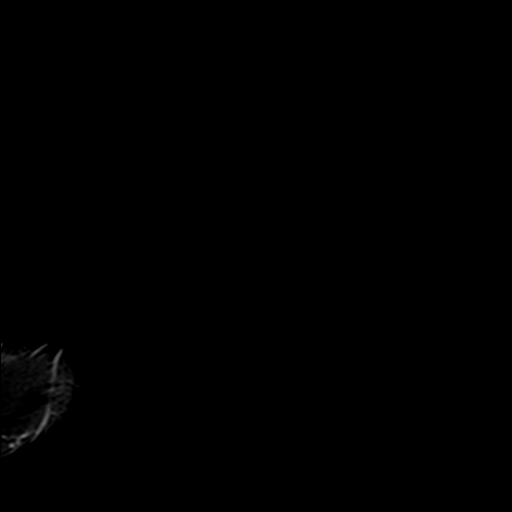
[im 6/23]
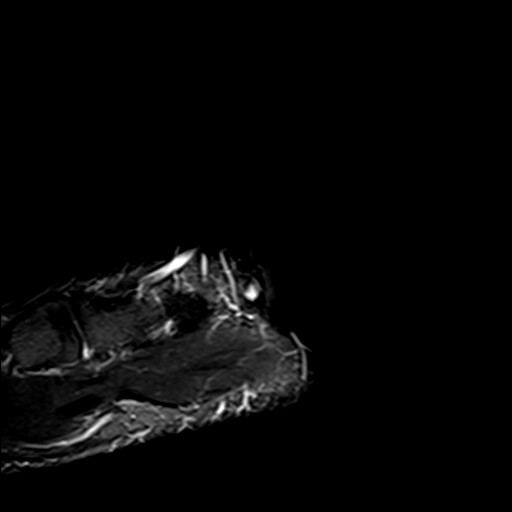
[im 12/23]
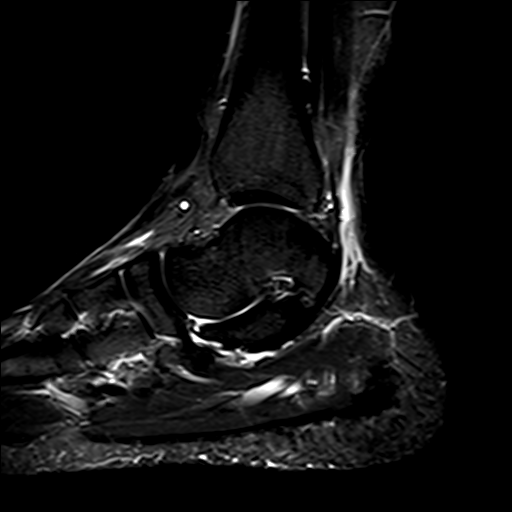
[im 17/23]
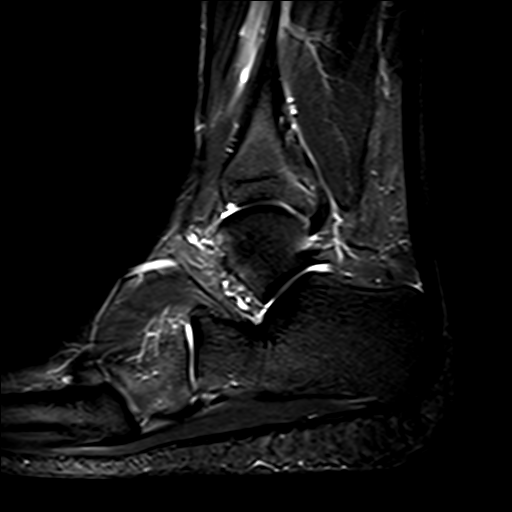
[im 23/23]
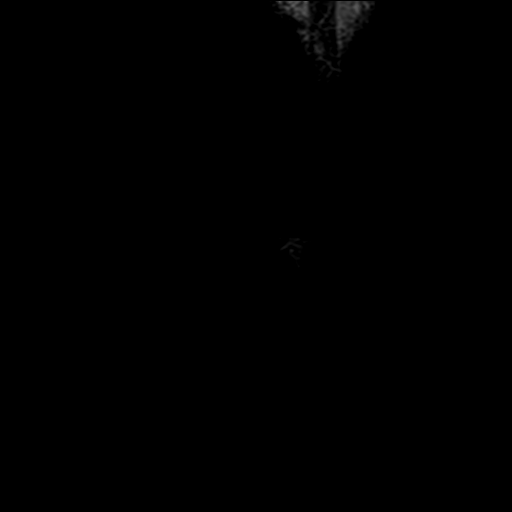

[Series 8: T2 fat-sat · coronal · 3.0mm · 0.50mm/px · 10 of 43 slices shown (2 of 2)]
[im 1/43]
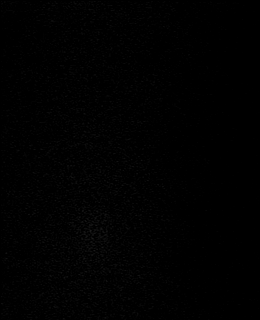
[im 5/43]
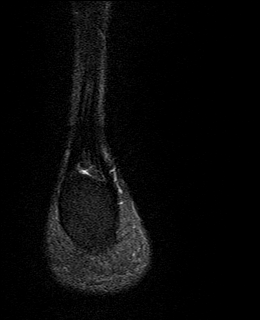
[im 10/43]
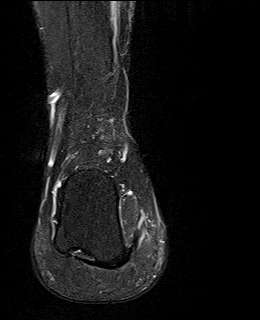
[im 15/43]
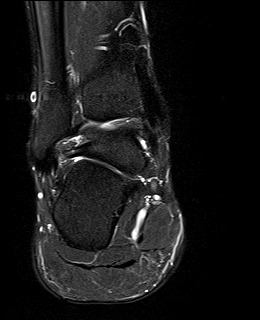
[im 19/43]
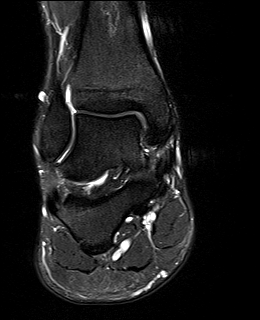
[im 24/43]
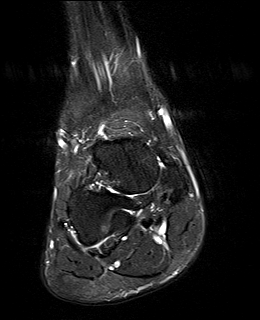
[im 29/43]
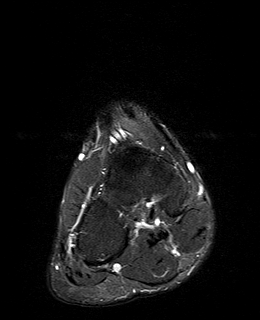
[im 33/43]
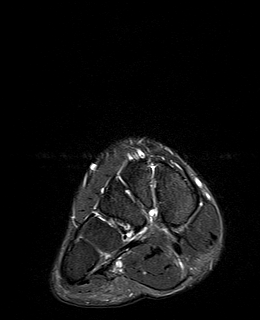
[im 38/43]
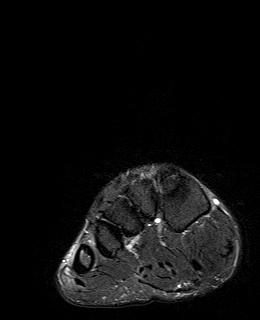
[im 43/43]
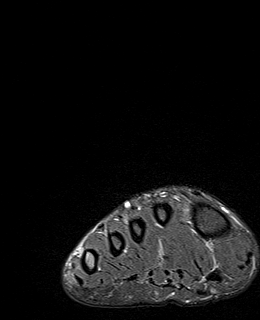

[40 of 40 positions shown; findings below may reference images not displayed]

FINDINGS: TENDONS

Peroneal: Peroneal longus tendon intact. Peroneal brevis intact.

Posteromedial: Posterior tibial tendon intact. Flexor digitorum
longus tendon intact. Flexor hallucis longus tendon intact.

Anterior: Tibialis anterior tendon intact. Extensor hallucis longus
tendon intact Extensor digitorum longus tendon intact.

Achilles: Intact. Focal mild tendinosis of the posterior mid tendon
(series 4, image 10).

Plantar Fascia: Intact.

LIGAMENTS

Lateral: Anterior talofibular ligament intact. Calcaneofibular
ligament intact. Posterior talofibular ligament intact. Anterior and
posterior tibiofibular ligaments intact.

Medial: Deltoid ligament intact. Spring ligament intact.

CARTILAGE

Ankle Joint: No significant joint effusion. Normal ankle mortise. No
chondral defect.

Subtalar Joints/Sinus Tarsi: Normal subtalar joints. No significant
subtalar joint effusion. Normal sinus tarsi.

Bones: No marrow signal abnormality.  No fracture or dislocation.

Soft Tissue: No soft tissue mass or fluid collection.
IMPRESSION: 1. No acute abnormality.
2. Focal mild tendinosis of the posterior mid Achilles tendon. No
tear.

## 2020-10-01 ENCOUNTER — Ambulatory Visit: Payer: BC Managed Care – PPO | Admitting: Podiatry

## 2020-10-01 ENCOUNTER — Other Ambulatory Visit: Payer: Self-pay

## 2020-10-01 ENCOUNTER — Encounter: Payer: Self-pay | Admitting: *Deleted

## 2020-10-01 DIAGNOSIS — M6788 Other specified disorders of synovium and tendon, other site: Secondary | ICD-10-CM | POA: Diagnosis not present

## 2020-10-01 DIAGNOSIS — M7660 Achilles tendinitis, unspecified leg: Secondary | ICD-10-CM | POA: Diagnosis not present

## 2020-10-01 MED ORDER — MELOXICAM 15 MG PO TABS: 15.0000 mg | ORAL_TABLET | Freq: Every day | ORAL | 1 refills | Status: AC

## 2020-10-01 NOTE — Progress Notes (Signed)
   HPI: 40 y.o. male presenting today for follow-up evaluation of Achilles tendon pain to the right lower extremity that occurred in January during basketball.  Last visit on 09/17/2020 MRI was ordered.  He presents today to review the MRI results and discuss further treatment options  No past medical history on file.   Physical Exam: General: The patient is alert and oriented x3 in no acute distress.  Dermatology: Skin is warm, dry and supple bilateral lower extremities. Negative for open lesions or macerations.  Vascular: Palpable pedal pulses bilaterally. No edema or erythema noted. Capillary refill within normal limits.  Neurological: Epicritic and protective threshold grossly intact bilaterally.   Musculoskeletal Exam: Range of motion within normal limits to all pedal and ankle joints bilateral. Muscle strength 5/5 in all groups bilateral.  There continues to be pain on palpation along the Achilles tendon right lower extremity mid substance.  There is also pain with forced plantar flexion against resistance.  MRI impression right ankle 09/27/2020: 1. No acute abnormality. 2. Focal mild tendinosis of the posterior mid Achilles tendon. No tear.  Assessment: 1.  Achilles tendon injury right 2.  Achilles tendinitis/tendinosis right lower extremity   Plan of Care:  1. Patient evaluated.  MRI reviewed.  2.  Refrain from work x6 weeks to allow the Achilles tendon to heal 3.  Continue weightbearing in the cam boot x4 weeks.  At that time he may transition of the cam boot 4.  Continue passive range of motion and light stretching exercises nonweightbearing in the evenings 5.  Prescription for meloxicam 15 mg daily 6.  Return to clinic in 6 weeks for follow-up and to schedule the patient to return to work.  If the patient is feeling well we may be able to return him to work sooner with restrictions  *Works at [employer], Teacher, adult education for cars, furniture, etc      Felecia Shelling,  DPM [facility]  Dr. Felecia Shelling, DPM    [address] [place], [place] [address]                Office [phone]  Fax [phone]

## 2020-10-18 DIAGNOSIS — M9902 Segmental and somatic dysfunction of thoracic region: Secondary | ICD-10-CM | POA: Diagnosis not present

## 2020-10-18 DIAGNOSIS — M40202 Unspecified kyphosis, cervical region: Secondary | ICD-10-CM | POA: Diagnosis not present

## 2020-10-18 DIAGNOSIS — M9907 Segmental and somatic dysfunction of upper extremity: Secondary | ICD-10-CM | POA: Diagnosis not present

## 2020-10-18 DIAGNOSIS — M9901 Segmental and somatic dysfunction of cervical region: Secondary | ICD-10-CM | POA: Diagnosis not present

## 2020-11-01 DIAGNOSIS — M9901 Segmental and somatic dysfunction of cervical region: Secondary | ICD-10-CM | POA: Diagnosis not present

## 2020-11-01 DIAGNOSIS — M40202 Unspecified kyphosis, cervical region: Secondary | ICD-10-CM | POA: Diagnosis not present

## 2020-11-01 DIAGNOSIS — M9902 Segmental and somatic dysfunction of thoracic region: Secondary | ICD-10-CM | POA: Diagnosis not present

## 2020-11-01 DIAGNOSIS — M9907 Segmental and somatic dysfunction of upper extremity: Secondary | ICD-10-CM | POA: Diagnosis not present

## 2020-11-12 ENCOUNTER — Other Ambulatory Visit: Payer: Self-pay

## 2020-11-12 ENCOUNTER — Ambulatory Visit: Payer: BC Managed Care – PPO | Admitting: Podiatry

## 2020-11-12 ENCOUNTER — Encounter: Payer: Self-pay | Admitting: *Deleted

## 2020-11-12 DIAGNOSIS — M6788 Other specified disorders of synovium and tendon, other site: Secondary | ICD-10-CM | POA: Diagnosis not present

## 2020-11-12 NOTE — Progress Notes (Signed)
   HPI: 40 y.o. male presenting today for follow-up evaluation of Achilles tendon pain to the right lower extremity that occurred in January during basketball.  Patient states that overall there is some improvement.  He tried to transition out of the cam boot approximately 2 weeks ago but he had some tenderness and increased pain so he has been wearing a cam boot.  He has not been taking the meloxicam as prescribed.  No past medical history on file.   Physical Exam: General: The patient is alert and oriented x3 in no acute distress.  Dermatology: Skin is warm, dry and supple bilateral lower extremities. Negative for open lesions or macerations.  Vascular: Palpable pedal pulses bilaterally. No edema or erythema noted. Capillary refill within normal limits.  Neurological: Epicritic and protective threshold grossly intact bilaterally.   Musculoskeletal Exam: Range of motion within normal limits to all pedal and ankle joints bilateral. Muscle strength 5/5 in all groups bilateral.  There continues to be pain on palpation along the Achilles tendon right lower extremity mid substance.  There is also pain with forced plantar flexion against resistance.  MRI impression right ankle 09/27/2020: 1. No acute abnormality. 2. Focal mild tendinosis of the posterior mid Achilles tendon. No tear.  Assessment: 1.  Achilles tendon injury right 2.  Achilles tendinitis/tendinosis right lower extremity   Plan of Care:  1. Patient evaluated.  MRI reviewed.  2.  Patient okay to return to work 12/02/2020 with restrictions x1 month. 3.  Discontinue cam boot.  Ankle brace dispensed.  Wear daily 4.  Continue stretching exercises and strengthening exercises daily.  Patient declined physical therapy 5.  Continue meloxicam as needed  6.  Return to clinic in 6 weeks  *Works at [employer], Teacher, adult education for cars, furniture, etc      Felecia Shelling, DPM [facility]  Dr. Felecia Shelling, DPM     [address] [place], [place] [address]                Office [phone]  Fax [phone]

## 2020-11-14 DIAGNOSIS — M79676 Pain in unspecified toe(s): Secondary | ICD-10-CM

## 2020-11-15 DIAGNOSIS — M40202 Unspecified kyphosis, cervical region: Secondary | ICD-10-CM | POA: Diagnosis not present

## 2020-11-15 DIAGNOSIS — M9901 Segmental and somatic dysfunction of cervical region: Secondary | ICD-10-CM | POA: Diagnosis not present

## 2020-11-15 DIAGNOSIS — M9902 Segmental and somatic dysfunction of thoracic region: Secondary | ICD-10-CM | POA: Diagnosis not present

## 2020-11-15 DIAGNOSIS — M9907 Segmental and somatic dysfunction of upper extremity: Secondary | ICD-10-CM | POA: Diagnosis not present

## 2020-11-22 DIAGNOSIS — M9901 Segmental and somatic dysfunction of cervical region: Secondary | ICD-10-CM | POA: Diagnosis not present

## 2020-11-22 DIAGNOSIS — M9907 Segmental and somatic dysfunction of upper extremity: Secondary | ICD-10-CM | POA: Diagnosis not present

## 2020-11-22 DIAGNOSIS — M9902 Segmental and somatic dysfunction of thoracic region: Secondary | ICD-10-CM | POA: Diagnosis not present

## 2020-11-22 DIAGNOSIS — M40202 Unspecified kyphosis, cervical region: Secondary | ICD-10-CM | POA: Diagnosis not present

## 2020-12-13 DIAGNOSIS — M9907 Segmental and somatic dysfunction of upper extremity: Secondary | ICD-10-CM | POA: Diagnosis not present

## 2020-12-13 DIAGNOSIS — M40202 Unspecified kyphosis, cervical region: Secondary | ICD-10-CM | POA: Diagnosis not present

## 2020-12-13 DIAGNOSIS — M9902 Segmental and somatic dysfunction of thoracic region: Secondary | ICD-10-CM | POA: Diagnosis not present

## 2020-12-13 DIAGNOSIS — M9901 Segmental and somatic dysfunction of cervical region: Secondary | ICD-10-CM | POA: Diagnosis not present

## 2020-12-27 DIAGNOSIS — M9902 Segmental and somatic dysfunction of thoracic region: Secondary | ICD-10-CM | POA: Diagnosis not present

## 2020-12-27 DIAGNOSIS — M40202 Unspecified kyphosis, cervical region: Secondary | ICD-10-CM | POA: Diagnosis not present

## 2020-12-27 DIAGNOSIS — M9901 Segmental and somatic dysfunction of cervical region: Secondary | ICD-10-CM | POA: Diagnosis not present

## 2020-12-27 DIAGNOSIS — M9907 Segmental and somatic dysfunction of upper extremity: Secondary | ICD-10-CM | POA: Diagnosis not present

## 2020-12-31 ENCOUNTER — Other Ambulatory Visit: Payer: Self-pay

## 2020-12-31 ENCOUNTER — Encounter: Payer: Self-pay | Admitting: Podiatry

## 2020-12-31 ENCOUNTER — Ambulatory Visit (INDEPENDENT_AMBULATORY_CARE_PROVIDER_SITE_OTHER): Payer: BC Managed Care – PPO | Admitting: Podiatry

## 2020-12-31 DIAGNOSIS — M7660 Achilles tendinitis, unspecified leg: Secondary | ICD-10-CM

## 2020-12-31 DIAGNOSIS — M6788 Other specified disorders of synovium and tendon, other site: Secondary | ICD-10-CM | POA: Diagnosis not present

## 2020-12-31 NOTE — Progress Notes (Signed)
   HPI: 40 y.o. male presenting today for follow-up evaluation of Achilles tendon pain to the right lower extremity that occurred in January during basketball.  Patient states that overall he is approximately 80% better.  He is no longer taking the meloxicam or wearing the ankle brace.  He has been working full activity with no restrictions.  No new complaints at this time  No past medical history on file.   Physical Exam: General: The patient is alert and oriented x3 in no acute distress.  Dermatology: Skin is warm, dry and supple bilateral lower extremities. Negative for open lesions or macerations.  Vascular: Palpable pedal pulses bilaterally. No edema or erythema noted. Capillary refill within normal limits.  Neurological: Epicritic and protective threshold grossly intact bilaterally.   Musculoskeletal Exam: Range of motion within normal limits to all pedal and ankle joints bilateral. Muscle strength 5/5 in all groups bilateral.  Today there is minimal pain on palpation along the Achilles tendon right lower extremity mid substance.  There is also pain with forced plantar flexion against resistance.  MRI impression right ankle 09/27/2020: 1. No acute abnormality. 2. Focal mild tendinosis of the posterior mid Achilles tendon. No tear.  Assessment: 1.  Achilles tendon injury right 2.  Achilles tendinitis/tendinosis right lower extremity   Plan of Care:  1. Patient evaluated.   2.  Patient may resume full activity no restrictions at work 3.  Slowly increase activity.  Continue daily stretching exercises.  Patient states that he is approximately 80% better. 4.  Continue meloxicam as needed 5.  Continue ankle brace as needed 6.  Return to clinic as needed  *Works at [employer], Teacher, adult education for cars, furniture, etc      Felecia Shelling, DPM [facility]  Dr. Felecia Shelling, DPM    [address] [place], [place]  [address]                Office [phone]  Fax [phone]

## 2021-01-06 DIAGNOSIS — M9902 Segmental and somatic dysfunction of thoracic region: Secondary | ICD-10-CM | POA: Diagnosis not present

## 2021-01-06 DIAGNOSIS — M9907 Segmental and somatic dysfunction of upper extremity: Secondary | ICD-10-CM | POA: Diagnosis not present

## 2021-01-06 DIAGNOSIS — M9901 Segmental and somatic dysfunction of cervical region: Secondary | ICD-10-CM | POA: Diagnosis not present

## 2021-01-06 DIAGNOSIS — M40202 Unspecified kyphosis, cervical region: Secondary | ICD-10-CM | POA: Diagnosis not present

## 2021-02-07 DIAGNOSIS — M9902 Segmental and somatic dysfunction of thoracic region: Secondary | ICD-10-CM | POA: Diagnosis not present

## 2021-02-07 DIAGNOSIS — M9901 Segmental and somatic dysfunction of cervical region: Secondary | ICD-10-CM | POA: Diagnosis not present

## 2021-02-07 DIAGNOSIS — M9907 Segmental and somatic dysfunction of upper extremity: Secondary | ICD-10-CM | POA: Diagnosis not present

## 2021-02-07 DIAGNOSIS — M40202 Unspecified kyphosis, cervical region: Secondary | ICD-10-CM | POA: Diagnosis not present

## 2021-02-21 DIAGNOSIS — M9901 Segmental and somatic dysfunction of cervical region: Secondary | ICD-10-CM | POA: Diagnosis not present

## 2021-02-21 DIAGNOSIS — M9907 Segmental and somatic dysfunction of upper extremity: Secondary | ICD-10-CM | POA: Diagnosis not present

## 2021-02-21 DIAGNOSIS — M9902 Segmental and somatic dysfunction of thoracic region: Secondary | ICD-10-CM | POA: Diagnosis not present

## 2021-02-21 DIAGNOSIS — M40202 Unspecified kyphosis, cervical region: Secondary | ICD-10-CM | POA: Diagnosis not present

## 2021-10-01 ENCOUNTER — Other Ambulatory Visit: Payer: Self-pay | Admitting: Internal Medicine

## 2021-10-01 DIAGNOSIS — Z13220 Encounter for screening for lipoid disorders: Secondary | ICD-10-CM | POA: Diagnosis not present

## 2021-10-01 DIAGNOSIS — R200 Anesthesia of skin: Secondary | ICD-10-CM | POA: Diagnosis not present

## 2021-10-01 DIAGNOSIS — M25562 Pain in left knee: Secondary | ICD-10-CM | POA: Diagnosis not present

## 2021-10-01 DIAGNOSIS — Z113 Encounter for screening for infections with a predominantly sexual mode of transmission: Secondary | ICD-10-CM | POA: Diagnosis not present

## 2021-10-01 DIAGNOSIS — Z131 Encounter for screening for diabetes mellitus: Secondary | ICD-10-CM | POA: Diagnosis not present

## 2021-10-01 DIAGNOSIS — E559 Vitamin D deficiency, unspecified: Secondary | ICD-10-CM | POA: Diagnosis not present

## 2021-10-02 LAB — COMPLETE METABOLIC PANEL WITH GFR
AG Ratio: 1.6 (calc) (ref 1.0–2.5)
ALT: 23 U/L (ref 9–46)
AST: 20 U/L (ref 10–40)
Albumin: 4.5 g/dL (ref 3.6–5.1)
Alkaline phosphatase (APISO): 84 U/L (ref 36–130)
BUN: 12 mg/dL (ref 7–25)
CO2: 23 mmol/L (ref 20–32)
Calcium: 9.2 mg/dL (ref 8.6–10.3)
Chloride: 104 mmol/L (ref 98–110)
Creat: 1.19 mg/dL (ref 0.60–1.26)
Globulin: 2.8 g/dL (calc) (ref 1.9–3.7)
Glucose, Bld: 76 mg/dL (ref 65–99)
Potassium: 4.6 mmol/L (ref 3.5–5.3)
Sodium: 138 mmol/L (ref 135–146)
Total Bilirubin: 0.5 mg/dL (ref 0.2–1.2)
Total Protein: 7.3 g/dL (ref 6.1–8.1)
eGFR: 80 mL/min/{1.73_m2} (ref >=60)

## 2021-10-02 LAB — CBC
HCT: 46.5 % (ref 38.5–50.0)
Hemoglobin: 15.9 g/dL (ref 13.2–17.1)
MCH: 30.6 pg (ref 27.0–33.0)
MCHC: 34.2 g/dL (ref 32.0–36.0)
MCV: 89.6 fL (ref 80.0–100.0)
MPV: 9.7 fL (ref 7.5–12.5)
Platelets: 328 10*3/uL (ref 140–400)
RBC: 5.19 10*6/uL (ref 4.20–5.80)
RDW: 13.1 % (ref 11.0–15.0)
WBC: 4.9 10*3/uL (ref 3.8–10.8)

## 2021-10-02 LAB — RPR (MONITOR) W/REFL: RPR (Monitor) w/refl Titer: NONREACTIVE

## 2021-10-02 LAB — LIPID PANEL
Cholesterol: 176 mg/dL (ref <200)
HDL: 31 mg/dL — ABNORMAL LOW (ref >=40)
Non-HDL Cholesterol (Calc): 145 mg/dL (calc) — ABNORMAL HIGH (ref <130)
Total CHOL/HDL Ratio: 5.7 (calc) — ABNORMAL HIGH (ref <5.0)
Triglycerides: 458 mg/dL — ABNORMAL HIGH (ref <150)

## 2021-10-02 LAB — VITAMIN B12: Vitamin B-12: 356 pg/mL (ref 200–1100)

## 2021-10-02 LAB — HIV ANTIBODY (ROUTINE TESTING W REFLEX): HIV 1&2 Ab, 4th Generation: NONREACTIVE

## 2021-10-02 LAB — VITAMIN D 25 HYDROXY (VIT D DEFICIENCY, FRACTURES): Vit D, 25-Hydroxy: 23 ng/mL — ABNORMAL LOW (ref 30–100)

## 2021-10-02 LAB — FOLATE: Folate: >24 ng/mL

## 2021-10-15 DIAGNOSIS — R202 Paresthesia of skin: Secondary | ICD-10-CM | POA: Diagnosis not present

## 2021-10-16 DIAGNOSIS — G5622 Lesion of ulnar nerve, left upper limb: Secondary | ICD-10-CM | POA: Diagnosis not present

## 2021-11-04 ENCOUNTER — Other Ambulatory Visit: Payer: Self-pay | Admitting: Neurology

## 2021-11-04 DIAGNOSIS — G5622 Lesion of ulnar nerve, left upper limb: Secondary | ICD-10-CM

## 2021-11-10 ENCOUNTER — Inpatient Hospital Stay: Admission: RE | Admit: 2021-11-10 | Payer: BC Managed Care – PPO | Source: Ambulatory Visit

## 2021-11-12 ENCOUNTER — Ambulatory Visit
Admission: RE | Admit: 2021-11-12 | Discharge: 2021-11-12 | Disposition: A | Discharge status: Home / Self Care | Payer: BC Managed Care – PPO | Source: Ambulatory Visit | Attending: Neurology | Admitting: Neurology

## 2021-11-12 DIAGNOSIS — G5622 Lesion of ulnar nerve, left upper limb: Secondary | ICD-10-CM

## 2021-11-12 DIAGNOSIS — R200 Anesthesia of skin: Secondary | ICD-10-CM | POA: Diagnosis not present

## 2021-11-12 DIAGNOSIS — M25522 Pain in left elbow: Secondary | ICD-10-CM | POA: Diagnosis not present

## 2021-11-12 IMAGING — MR MR ELBOW*L* W/O CM
4 of 5 series · 12 of 40 positions shown · non-contrast
Comparison: None.

CLINICAL DATA: Left elbow pain and numbness for 1 year

EXAM:
MRI OF THE LEFT ELBOW WITHOUT CONTRAST
TECHNIQUE: Multiplanar, multisequence MR imaging of the elbow was performed. No
intravenous contrast was administered.

[Series 3: T1 · axial · left · 3.0mm · 0.16mm/px · z∈[-43,+31]mm · 3 of 27 slices shown]
[im 4/27]
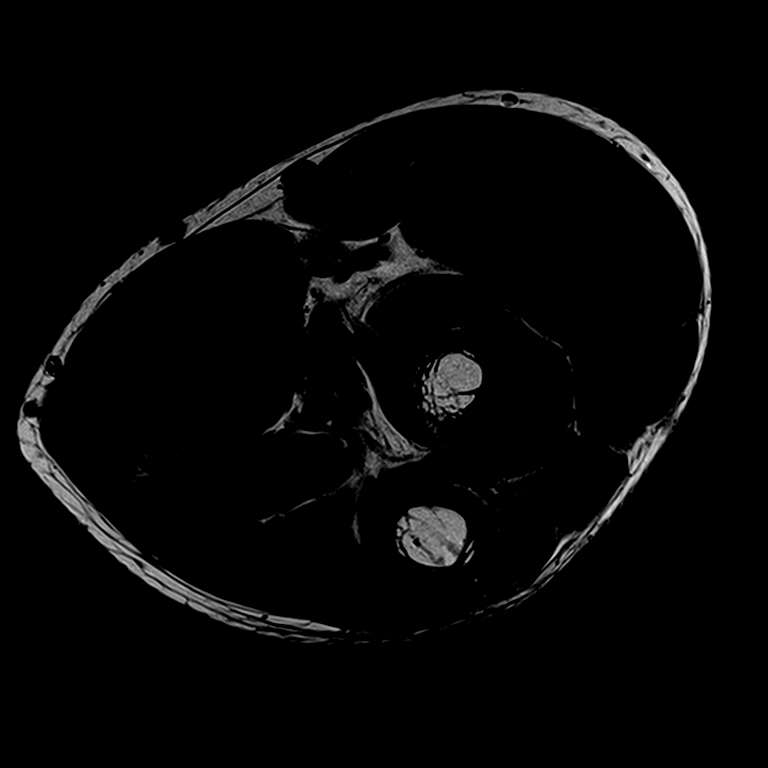
[im 15/27]
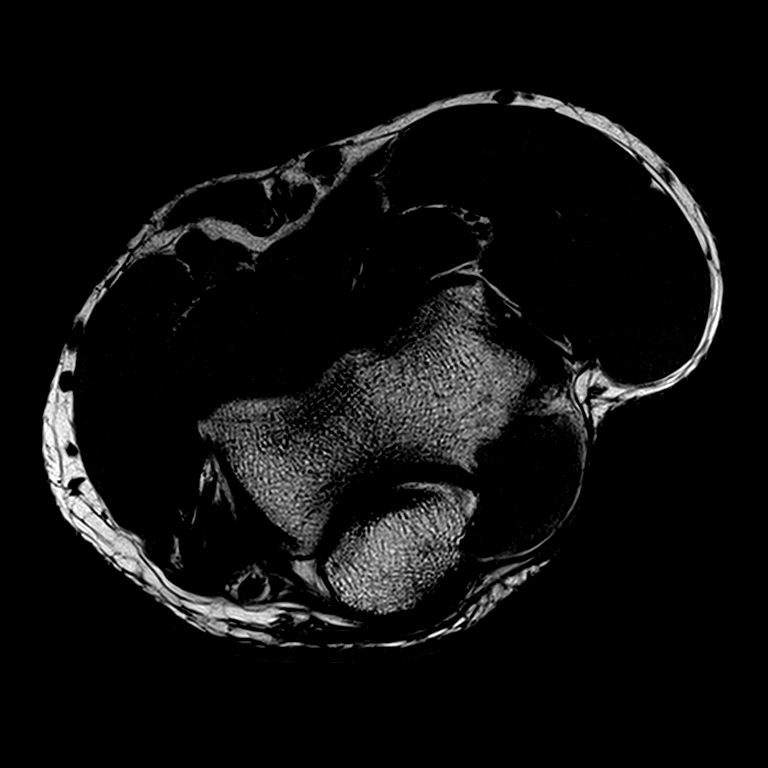
[im 23/27]
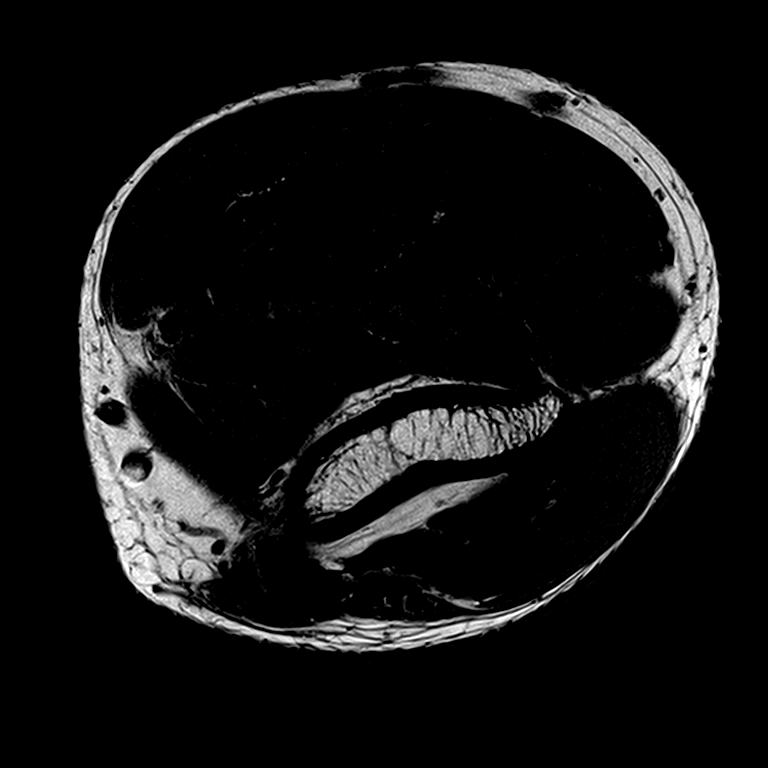

[Series 4: T2 fat-sat · axial · left · 3.0mm · 0.19mm/px · z∈[-43,+31]mm · 3 of 27 slices shown (1 of 3)]
[im 4/27]
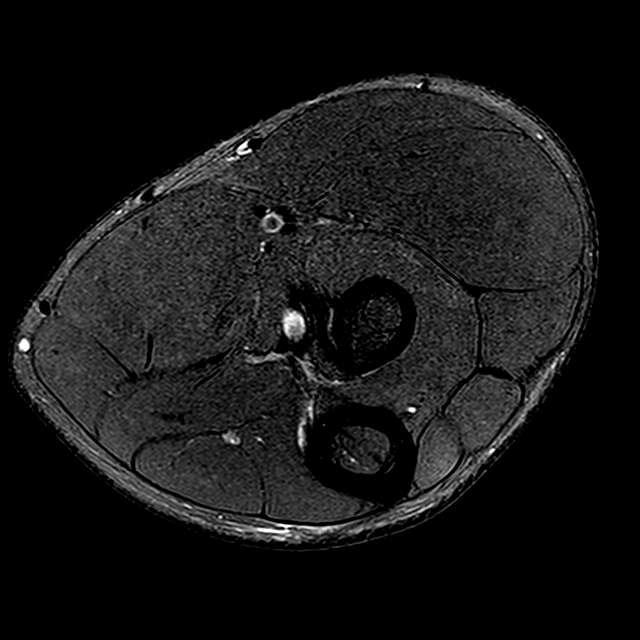
[im 14/27]
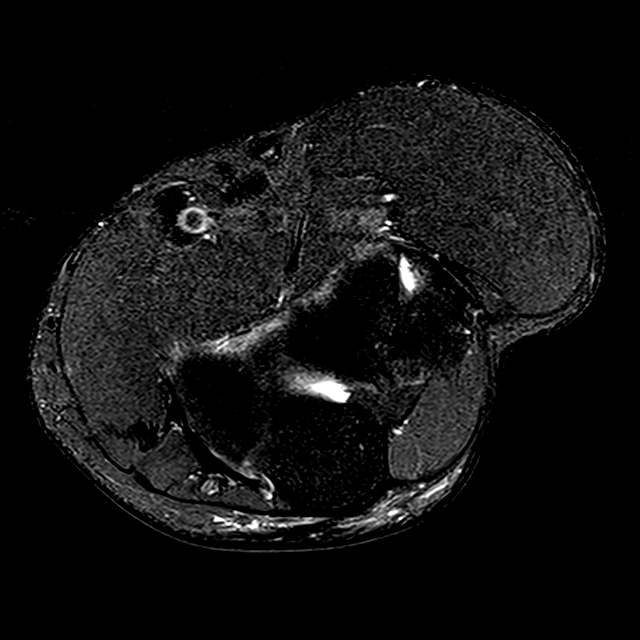
[im 23/27]
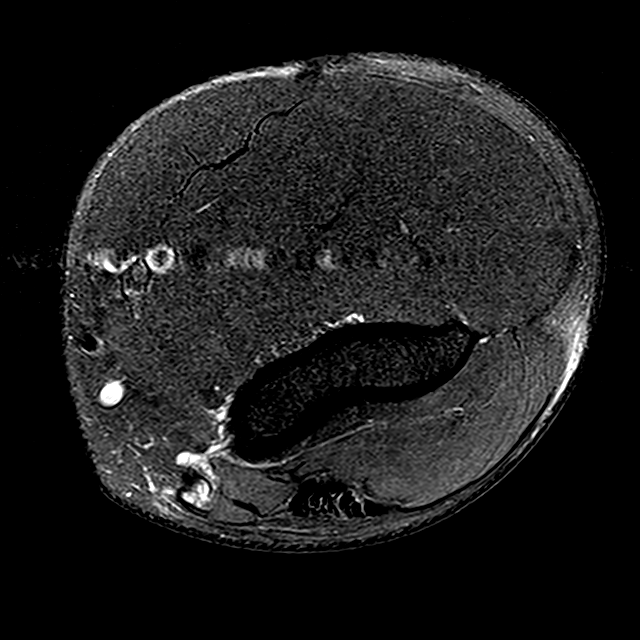

[Series 5: T2 fat-sat · coronal · left · 3.0mm · 0.22mm/px · 3 of 21 slices shown (2 of 3)]
[im 4/21]
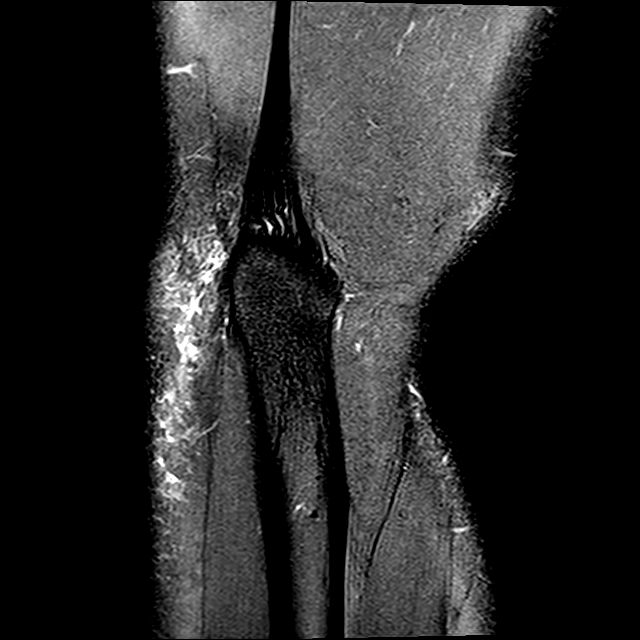
[im 11/21]
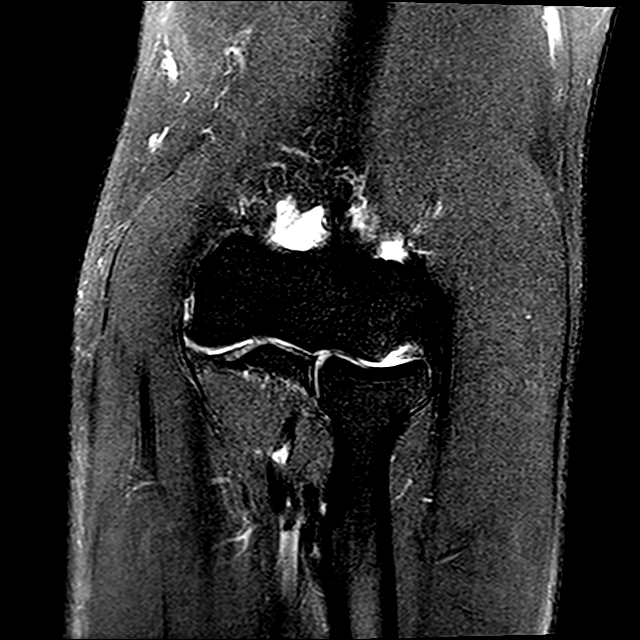
[im 17/21]
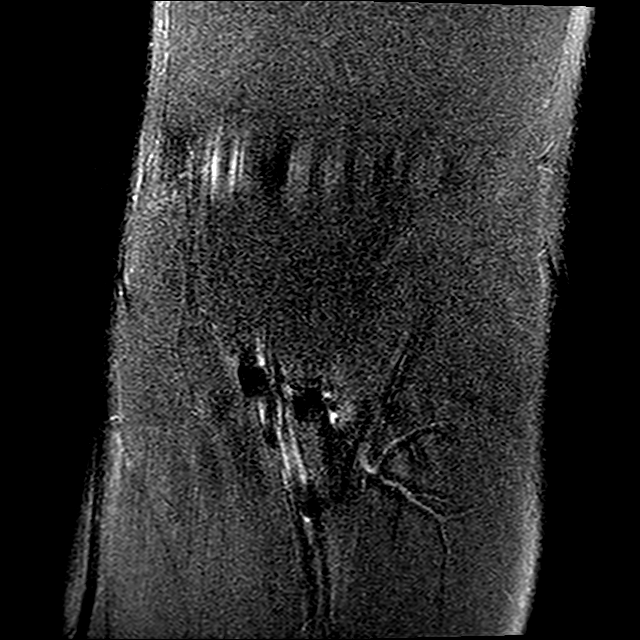

[Series 7: T2 fat-sat · sagittal · left · 3.0mm · 0.22mm/px · 3 of 26 slices shown (3 of 3)]
[im 4/26]
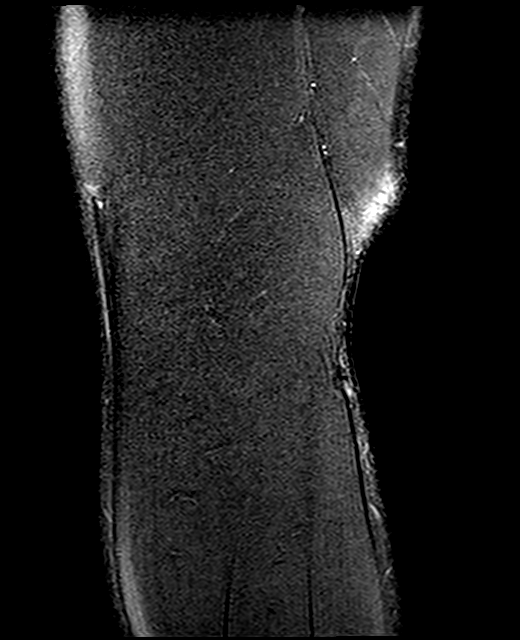
[im 13/26]
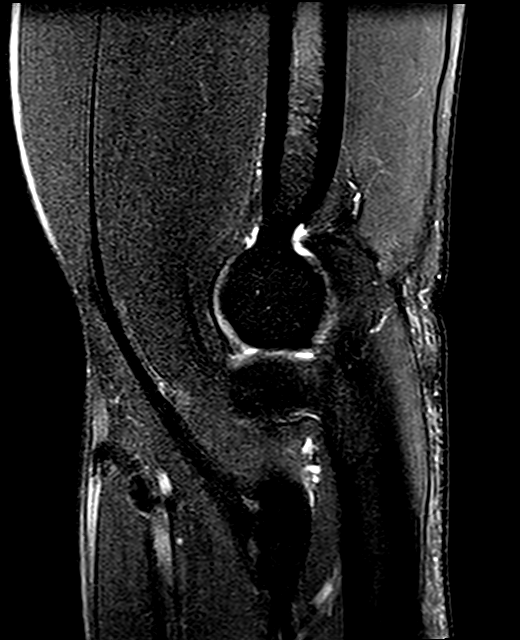
[im 22/26]
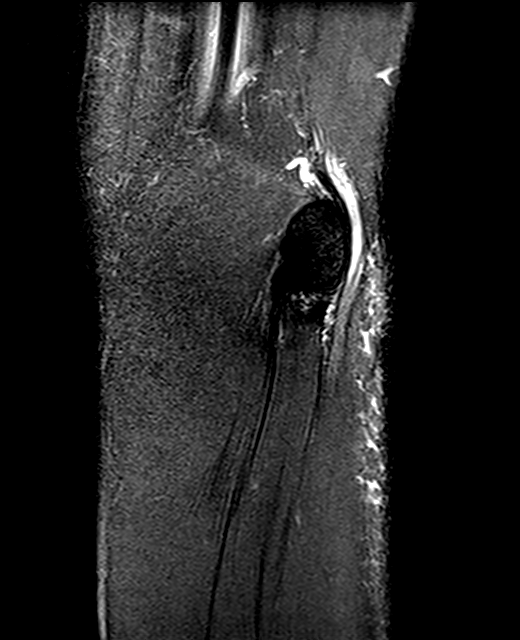

[12 of 40 positions shown; findings below may reference images not displayed]

FINDINGS: TENDONS

Common forearm flexor origin: Mild tendinosis of the common flexor
tendon origin without tear.

Common forearm extensor origin: Intact without tendinosis or tear.

Biceps: Intact with mild tendinosis distally. No bicipitoradialis
bursal fluid.

Triceps: Intact.

LIGAMENTS

Medial stabilizers: Intact.

Lateral stabilizers:  Intact.

Cartilage: Mild chondral thinning without focal defect.

Joint: No joint effusion or intra-articular loose body.

Cubital tunnel: The ulnar nerve is mildly enlarged with increased T2
hyperintensity within and just proximal to the cubital tunnel
(series 4, images 4-9). Mild spurring along the medial margin of the
olecranon protruding into the cubital tunnel.

Bones: No acute fracture. No dislocation. No bone marrow edema. No
bone lesion.

Muscles: Normal bulk and signal intensity of the included
musculature. No evidence of denervation.

Soft tissues: No soft tissue edema or fluid collection.

Other: None.
IMPRESSION: 1. Findings of cubital tunnel syndrome. No evidence of acute or
chronic denervation changes within the included musculature.
2. Mild common flexor tendinosis without tear.
3. Mild distal biceps tendinosis.

## 2021-11-13 DIAGNOSIS — M9902 Segmental and somatic dysfunction of thoracic region: Secondary | ICD-10-CM | POA: Diagnosis not present

## 2021-11-13 DIAGNOSIS — M9901 Segmental and somatic dysfunction of cervical region: Secondary | ICD-10-CM | POA: Diagnosis not present

## 2021-11-13 DIAGNOSIS — G5622 Lesion of ulnar nerve, left upper limb: Secondary | ICD-10-CM | POA: Diagnosis not present

## 2021-11-13 DIAGNOSIS — M9907 Segmental and somatic dysfunction of upper extremity: Secondary | ICD-10-CM | POA: Diagnosis not present

## 2021-11-13 DIAGNOSIS — M40202 Unspecified kyphosis, cervical region: Secondary | ICD-10-CM | POA: Diagnosis not present

## 2021-11-18 DIAGNOSIS — M25562 Pain in left knee: Secondary | ICD-10-CM | POA: Diagnosis not present

## 2021-11-18 DIAGNOSIS — R200 Anesthesia of skin: Secondary | ICD-10-CM | POA: Diagnosis not present

## 2021-11-27 DIAGNOSIS — G5622 Lesion of ulnar nerve, left upper limb: Secondary | ICD-10-CM | POA: Diagnosis not present

## 2021-12-03 DIAGNOSIS — M25562 Pain in left knee: Secondary | ICD-10-CM | POA: Diagnosis not present

## 2021-12-31 DIAGNOSIS — M25561 Pain in right knee: Secondary | ICD-10-CM | POA: Diagnosis not present

## 2022-01-08 DIAGNOSIS — M25562 Pain in left knee: Secondary | ICD-10-CM | POA: Diagnosis not present

## 2022-01-08 DIAGNOSIS — M25561 Pain in right knee: Secondary | ICD-10-CM | POA: Diagnosis not present

## 2022-01-09 DIAGNOSIS — H16142 Punctate keratitis, left eye: Secondary | ICD-10-CM | POA: Diagnosis not present

## 2022-01-14 DIAGNOSIS — M25561 Pain in right knee: Secondary | ICD-10-CM | POA: Diagnosis not present

## 2022-01-14 DIAGNOSIS — M25562 Pain in left knee: Secondary | ICD-10-CM | POA: Diagnosis not present

## 2022-01-29 DIAGNOSIS — S83241A Other tear of medial meniscus, current injury, right knee, initial encounter: Secondary | ICD-10-CM | POA: Diagnosis not present

## 2022-01-29 DIAGNOSIS — S83231A Complex tear of medial meniscus, current injury, right knee, initial encounter: Secondary | ICD-10-CM | POA: Diagnosis not present

## 2022-01-29 DIAGNOSIS — Y999 Unspecified external cause status: Secondary | ICD-10-CM | POA: Diagnosis not present

## 2022-01-29 DIAGNOSIS — M94261 Chondromalacia, right knee: Secondary | ICD-10-CM | POA: Diagnosis not present

## 2022-01-29 DIAGNOSIS — X58XXXA Exposure to other specified factors, initial encounter: Secondary | ICD-10-CM | POA: Diagnosis not present

## 2022-02-12 DIAGNOSIS — Y999 Unspecified external cause status: Secondary | ICD-10-CM | POA: Diagnosis not present

## 2022-02-12 DIAGNOSIS — G8918 Other acute postprocedural pain: Secondary | ICD-10-CM | POA: Diagnosis not present

## 2022-02-12 DIAGNOSIS — S83272A Complex tear of lateral meniscus, current injury, left knee, initial encounter: Secondary | ICD-10-CM | POA: Diagnosis not present

## 2022-02-12 DIAGNOSIS — S83282A Other tear of lateral meniscus, current injury, left knee, initial encounter: Secondary | ICD-10-CM | POA: Diagnosis not present

## 2022-02-12 DIAGNOSIS — X58XXXA Exposure to other specified factors, initial encounter: Secondary | ICD-10-CM | POA: Diagnosis not present

## 2022-02-12 DIAGNOSIS — S83232A Complex tear of medial meniscus, current injury, left knee, initial encounter: Secondary | ICD-10-CM | POA: Diagnosis not present

## 2022-02-12 DIAGNOSIS — S83242A Other tear of medial meniscus, current injury, left knee, initial encounter: Secondary | ICD-10-CM | POA: Diagnosis not present

## 2022-02-24 DIAGNOSIS — M25569 Pain in unspecified knee: Secondary | ICD-10-CM | POA: Diagnosis not present

## 2022-02-24 DIAGNOSIS — E669 Obesity, unspecified: Secondary | ICD-10-CM | POA: Diagnosis not present

## 2022-02-25 DIAGNOSIS — S83272D Complex tear of lateral meniscus, current injury, left knee, subsequent encounter: Secondary | ICD-10-CM | POA: Diagnosis not present

## 2022-02-25 DIAGNOSIS — S83232D Complex tear of medial meniscus, current injury, left knee, subsequent encounter: Secondary | ICD-10-CM | POA: Diagnosis not present

## 2022-02-25 DIAGNOSIS — M6281 Muscle weakness (generalized): Secondary | ICD-10-CM | POA: Diagnosis not present

## 2022-02-25 DIAGNOSIS — R262 Difficulty in walking, not elsewhere classified: Secondary | ICD-10-CM | POA: Diagnosis not present

## 2022-02-26 DIAGNOSIS — G5622 Lesion of ulnar nerve, left upper limb: Secondary | ICD-10-CM | POA: Diagnosis not present

## 2022-02-26 DIAGNOSIS — Z6831 Body mass index (BMI) 31.0-31.9, adult: Secondary | ICD-10-CM | POA: Diagnosis not present

## 2022-03-03 DIAGNOSIS — S83232D Complex tear of medial meniscus, current injury, left knee, subsequent encounter: Secondary | ICD-10-CM | POA: Diagnosis not present

## 2022-03-03 DIAGNOSIS — M6281 Muscle weakness (generalized): Secondary | ICD-10-CM | POA: Diagnosis not present

## 2022-03-03 DIAGNOSIS — S83272D Complex tear of lateral meniscus, current injury, left knee, subsequent encounter: Secondary | ICD-10-CM | POA: Diagnosis not present

## 2022-03-03 DIAGNOSIS — R262 Difficulty in walking, not elsewhere classified: Secondary | ICD-10-CM | POA: Diagnosis not present

## 2022-03-04 DIAGNOSIS — R262 Difficulty in walking, not elsewhere classified: Secondary | ICD-10-CM | POA: Diagnosis not present

## 2022-03-04 DIAGNOSIS — S83232D Complex tear of medial meniscus, current injury, left knee, subsequent encounter: Secondary | ICD-10-CM | POA: Diagnosis not present

## 2022-03-04 DIAGNOSIS — M6281 Muscle weakness (generalized): Secondary | ICD-10-CM | POA: Diagnosis not present

## 2022-03-04 DIAGNOSIS — S83272D Complex tear of lateral meniscus, current injury, left knee, subsequent encounter: Secondary | ICD-10-CM | POA: Diagnosis not present

## 2022-03-10 DIAGNOSIS — R262 Difficulty in walking, not elsewhere classified: Secondary | ICD-10-CM | POA: Diagnosis not present

## 2022-03-10 DIAGNOSIS — M6281 Muscle weakness (generalized): Secondary | ICD-10-CM | POA: Diagnosis not present

## 2022-03-10 DIAGNOSIS — S83272D Complex tear of lateral meniscus, current injury, left knee, subsequent encounter: Secondary | ICD-10-CM | POA: Diagnosis not present

## 2022-03-10 DIAGNOSIS — S83232D Complex tear of medial meniscus, current injury, left knee, subsequent encounter: Secondary | ICD-10-CM | POA: Diagnosis not present

## 2022-03-11 DIAGNOSIS — S83272D Complex tear of lateral meniscus, current injury, left knee, subsequent encounter: Secondary | ICD-10-CM | POA: Diagnosis not present

## 2022-03-11 DIAGNOSIS — R262 Difficulty in walking, not elsewhere classified: Secondary | ICD-10-CM | POA: Diagnosis not present

## 2022-03-11 DIAGNOSIS — M6281 Muscle weakness (generalized): Secondary | ICD-10-CM | POA: Diagnosis not present

## 2022-03-11 DIAGNOSIS — S83232D Complex tear of medial meniscus, current injury, left knee, subsequent encounter: Secondary | ICD-10-CM | POA: Diagnosis not present

## 2022-03-17 DIAGNOSIS — S83272D Complex tear of lateral meniscus, current injury, left knee, subsequent encounter: Secondary | ICD-10-CM | POA: Diagnosis not present

## 2022-03-17 DIAGNOSIS — M6281 Muscle weakness (generalized): Secondary | ICD-10-CM | POA: Diagnosis not present

## 2022-03-17 DIAGNOSIS — R262 Difficulty in walking, not elsewhere classified: Secondary | ICD-10-CM | POA: Diagnosis not present

## 2022-03-17 DIAGNOSIS — S83232D Complex tear of medial meniscus, current injury, left knee, subsequent encounter: Secondary | ICD-10-CM | POA: Diagnosis not present

## 2022-03-20 DIAGNOSIS — S83272D Complex tear of lateral meniscus, current injury, left knee, subsequent encounter: Secondary | ICD-10-CM | POA: Diagnosis not present

## 2022-03-20 DIAGNOSIS — S83232D Complex tear of medial meniscus, current injury, left knee, subsequent encounter: Secondary | ICD-10-CM | POA: Diagnosis not present

## 2022-03-20 DIAGNOSIS — M6281 Muscle weakness (generalized): Secondary | ICD-10-CM | POA: Diagnosis not present

## 2022-03-20 DIAGNOSIS — R262 Difficulty in walking, not elsewhere classified: Secondary | ICD-10-CM | POA: Diagnosis not present

## 2022-03-24 DIAGNOSIS — R262 Difficulty in walking, not elsewhere classified: Secondary | ICD-10-CM | POA: Diagnosis not present

## 2022-03-24 DIAGNOSIS — M6281 Muscle weakness (generalized): Secondary | ICD-10-CM | POA: Diagnosis not present

## 2022-03-24 DIAGNOSIS — S83232D Complex tear of medial meniscus, current injury, left knee, subsequent encounter: Secondary | ICD-10-CM | POA: Diagnosis not present

## 2022-03-24 DIAGNOSIS — S83272D Complex tear of lateral meniscus, current injury, left knee, subsequent encounter: Secondary | ICD-10-CM | POA: Diagnosis not present

## 2022-03-30 DIAGNOSIS — M6281 Muscle weakness (generalized): Secondary | ICD-10-CM | POA: Diagnosis not present

## 2022-03-30 DIAGNOSIS — R262 Difficulty in walking, not elsewhere classified: Secondary | ICD-10-CM | POA: Diagnosis not present

## 2022-03-30 DIAGNOSIS — S83272D Complex tear of lateral meniscus, current injury, left knee, subsequent encounter: Secondary | ICD-10-CM | POA: Diagnosis not present

## 2022-03-30 DIAGNOSIS — S83232D Complex tear of medial meniscus, current injury, left knee, subsequent encounter: Secondary | ICD-10-CM | POA: Diagnosis not present

## 2022-04-03 DIAGNOSIS — R262 Difficulty in walking, not elsewhere classified: Secondary | ICD-10-CM | POA: Diagnosis not present

## 2022-04-03 DIAGNOSIS — M6281 Muscle weakness (generalized): Secondary | ICD-10-CM | POA: Diagnosis not present

## 2022-04-03 DIAGNOSIS — S83232D Complex tear of medial meniscus, current injury, left knee, subsequent encounter: Secondary | ICD-10-CM | POA: Diagnosis not present

## 2022-04-03 DIAGNOSIS — S83272D Complex tear of lateral meniscus, current injury, left knee, subsequent encounter: Secondary | ICD-10-CM | POA: Diagnosis not present

## 2022-04-09 DIAGNOSIS — S83232D Complex tear of medial meniscus, current injury, left knee, subsequent encounter: Secondary | ICD-10-CM | POA: Diagnosis not present

## 2022-04-09 DIAGNOSIS — R262 Difficulty in walking, not elsewhere classified: Secondary | ICD-10-CM | POA: Diagnosis not present

## 2022-04-09 DIAGNOSIS — M6281 Muscle weakness (generalized): Secondary | ICD-10-CM | POA: Diagnosis not present

## 2022-04-09 DIAGNOSIS — S83272D Complex tear of lateral meniscus, current injury, left knee, subsequent encounter: Secondary | ICD-10-CM | POA: Diagnosis not present

## 2022-05-11 DIAGNOSIS — R262 Difficulty in walking, not elsewhere classified: Secondary | ICD-10-CM | POA: Diagnosis not present

## 2022-05-11 DIAGNOSIS — S83272D Complex tear of lateral meniscus, current injury, left knee, subsequent encounter: Secondary | ICD-10-CM | POA: Diagnosis not present

## 2022-05-11 DIAGNOSIS — M6281 Muscle weakness (generalized): Secondary | ICD-10-CM | POA: Diagnosis not present

## 2022-05-11 DIAGNOSIS — S83232D Complex tear of medial meniscus, current injury, left knee, subsequent encounter: Secondary | ICD-10-CM | POA: Diagnosis not present

## 2022-08-27 DIAGNOSIS — E559 Vitamin D deficiency, unspecified: Secondary | ICD-10-CM | POA: Diagnosis not present

## 2022-08-27 DIAGNOSIS — Z13220 Encounter for screening for lipoid disorders: Secondary | ICD-10-CM | POA: Diagnosis not present

## 2022-08-27 DIAGNOSIS — N342 Other urethritis: Secondary | ICD-10-CM | POA: Diagnosis not present

## 2022-08-27 DIAGNOSIS — Z131 Encounter for screening for diabetes mellitus: Secondary | ICD-10-CM | POA: Diagnosis not present

## 2022-08-27 DIAGNOSIS — Z0000 Encounter for general adult medical examination without abnormal findings: Secondary | ICD-10-CM | POA: Diagnosis not present

## 2022-12-07 DIAGNOSIS — M9902 Segmental and somatic dysfunction of thoracic region: Secondary | ICD-10-CM | POA: Diagnosis not present

## 2022-12-07 DIAGNOSIS — M9901 Segmental and somatic dysfunction of cervical region: Secondary | ICD-10-CM | POA: Diagnosis not present

## 2022-12-07 DIAGNOSIS — M9907 Segmental and somatic dysfunction of upper extremity: Secondary | ICD-10-CM | POA: Diagnosis not present

## 2022-12-07 DIAGNOSIS — M40202 Unspecified kyphosis, cervical region: Secondary | ICD-10-CM | POA: Diagnosis not present

## 2022-12-24 DIAGNOSIS — M9902 Segmental and somatic dysfunction of thoracic region: Secondary | ICD-10-CM | POA: Diagnosis not present

## 2022-12-24 DIAGNOSIS — M9901 Segmental and somatic dysfunction of cervical region: Secondary | ICD-10-CM | POA: Diagnosis not present

## 2022-12-24 DIAGNOSIS — M40202 Unspecified kyphosis, cervical region: Secondary | ICD-10-CM | POA: Diagnosis not present

## 2022-12-24 DIAGNOSIS — M9907 Segmental and somatic dysfunction of upper extremity: Secondary | ICD-10-CM | POA: Diagnosis not present

## 2023-01-07 DIAGNOSIS — M9907 Segmental and somatic dysfunction of upper extremity: Secondary | ICD-10-CM | POA: Diagnosis not present

## 2023-01-07 DIAGNOSIS — M40202 Unspecified kyphosis, cervical region: Secondary | ICD-10-CM | POA: Diagnosis not present

## 2023-01-07 DIAGNOSIS — M9902 Segmental and somatic dysfunction of thoracic region: Secondary | ICD-10-CM | POA: Diagnosis not present

## 2023-01-07 DIAGNOSIS — M9901 Segmental and somatic dysfunction of cervical region: Secondary | ICD-10-CM | POA: Diagnosis not present

## 2023-01-27 DIAGNOSIS — M9902 Segmental and somatic dysfunction of thoracic region: Secondary | ICD-10-CM | POA: Diagnosis not present

## 2023-01-27 DIAGNOSIS — M9907 Segmental and somatic dysfunction of upper extremity: Secondary | ICD-10-CM | POA: Diagnosis not present

## 2023-01-27 DIAGNOSIS — M40202 Unspecified kyphosis, cervical region: Secondary | ICD-10-CM | POA: Diagnosis not present

## 2023-01-27 DIAGNOSIS — M9901 Segmental and somatic dysfunction of cervical region: Secondary | ICD-10-CM | POA: Diagnosis not present

## 2023-02-04 DIAGNOSIS — M9902 Segmental and somatic dysfunction of thoracic region: Secondary | ICD-10-CM | POA: Diagnosis not present

## 2023-02-04 DIAGNOSIS — M40202 Unspecified kyphosis, cervical region: Secondary | ICD-10-CM | POA: Diagnosis not present

## 2023-02-04 DIAGNOSIS — M9907 Segmental and somatic dysfunction of upper extremity: Secondary | ICD-10-CM | POA: Diagnosis not present

## 2023-02-04 DIAGNOSIS — M9901 Segmental and somatic dysfunction of cervical region: Secondary | ICD-10-CM | POA: Diagnosis not present

## 2023-03-11 DIAGNOSIS — M40202 Unspecified kyphosis, cervical region: Secondary | ICD-10-CM | POA: Diagnosis not present

## 2023-03-11 DIAGNOSIS — M9901 Segmental and somatic dysfunction of cervical region: Secondary | ICD-10-CM | POA: Diagnosis not present

## 2023-03-11 DIAGNOSIS — M9902 Segmental and somatic dysfunction of thoracic region: Secondary | ICD-10-CM | POA: Diagnosis not present

## 2023-03-11 DIAGNOSIS — M9907 Segmental and somatic dysfunction of upper extremity: Secondary | ICD-10-CM | POA: Diagnosis not present

## 2023-03-18 DIAGNOSIS — M9907 Segmental and somatic dysfunction of upper extremity: Secondary | ICD-10-CM | POA: Diagnosis not present

## 2023-03-18 DIAGNOSIS — M40202 Unspecified kyphosis, cervical region: Secondary | ICD-10-CM | POA: Diagnosis not present

## 2023-03-18 DIAGNOSIS — M9902 Segmental and somatic dysfunction of thoracic region: Secondary | ICD-10-CM | POA: Diagnosis not present

## 2023-03-18 DIAGNOSIS — M9901 Segmental and somatic dysfunction of cervical region: Secondary | ICD-10-CM | POA: Diagnosis not present

## 2023-04-01 DIAGNOSIS — M40202 Unspecified kyphosis, cervical region: Secondary | ICD-10-CM | POA: Diagnosis not present

## 2023-04-01 DIAGNOSIS — M9902 Segmental and somatic dysfunction of thoracic region: Secondary | ICD-10-CM | POA: Diagnosis not present

## 2023-04-01 DIAGNOSIS — M9907 Segmental and somatic dysfunction of upper extremity: Secondary | ICD-10-CM | POA: Diagnosis not present

## 2023-04-01 DIAGNOSIS — M9901 Segmental and somatic dysfunction of cervical region: Secondary | ICD-10-CM | POA: Diagnosis not present

## 2023-04-16 DIAGNOSIS — M9901 Segmental and somatic dysfunction of cervical region: Secondary | ICD-10-CM | POA: Diagnosis not present

## 2023-04-16 DIAGNOSIS — M9907 Segmental and somatic dysfunction of upper extremity: Secondary | ICD-10-CM | POA: Diagnosis not present

## 2023-04-16 DIAGNOSIS — M40202 Unspecified kyphosis, cervical region: Secondary | ICD-10-CM | POA: Diagnosis not present

## 2023-04-16 DIAGNOSIS — M9902 Segmental and somatic dysfunction of thoracic region: Secondary | ICD-10-CM | POA: Diagnosis not present

## 2023-05-14 DIAGNOSIS — M40202 Unspecified kyphosis, cervical region: Secondary | ICD-10-CM | POA: Diagnosis not present

## 2023-05-14 DIAGNOSIS — M9902 Segmental and somatic dysfunction of thoracic region: Secondary | ICD-10-CM | POA: Diagnosis not present

## 2023-05-14 DIAGNOSIS — M9907 Segmental and somatic dysfunction of upper extremity: Secondary | ICD-10-CM | POA: Diagnosis not present

## 2023-05-14 DIAGNOSIS — M9901 Segmental and somatic dysfunction of cervical region: Secondary | ICD-10-CM | POA: Diagnosis not present

## 2023-05-21 DIAGNOSIS — M40202 Unspecified kyphosis, cervical region: Secondary | ICD-10-CM | POA: Diagnosis not present

## 2023-05-21 DIAGNOSIS — M9902 Segmental and somatic dysfunction of thoracic region: Secondary | ICD-10-CM | POA: Diagnosis not present

## 2023-05-21 DIAGNOSIS — M9907 Segmental and somatic dysfunction of upper extremity: Secondary | ICD-10-CM | POA: Diagnosis not present

## 2023-05-21 DIAGNOSIS — M9901 Segmental and somatic dysfunction of cervical region: Secondary | ICD-10-CM | POA: Diagnosis not present

## 2023-06-11 DIAGNOSIS — M9907 Segmental and somatic dysfunction of upper extremity: Secondary | ICD-10-CM | POA: Diagnosis not present

## 2023-06-11 DIAGNOSIS — M9902 Segmental and somatic dysfunction of thoracic region: Secondary | ICD-10-CM | POA: Diagnosis not present

## 2023-06-11 DIAGNOSIS — M9901 Segmental and somatic dysfunction of cervical region: Secondary | ICD-10-CM | POA: Diagnosis not present

## 2023-06-11 DIAGNOSIS — M40202 Unspecified kyphosis, cervical region: Secondary | ICD-10-CM | POA: Diagnosis not present

## 2023-06-25 DIAGNOSIS — M9901 Segmental and somatic dysfunction of cervical region: Secondary | ICD-10-CM | POA: Diagnosis not present

## 2023-06-25 DIAGNOSIS — M9902 Segmental and somatic dysfunction of thoracic region: Secondary | ICD-10-CM | POA: Diagnosis not present

## 2023-06-25 DIAGNOSIS — M40202 Unspecified kyphosis, cervical region: Secondary | ICD-10-CM | POA: Diagnosis not present

## 2023-06-25 DIAGNOSIS — M9907 Segmental and somatic dysfunction of upper extremity: Secondary | ICD-10-CM | POA: Diagnosis not present

## 2023-07-09 DIAGNOSIS — M9907 Segmental and somatic dysfunction of upper extremity: Secondary | ICD-10-CM | POA: Diagnosis not present

## 2023-07-09 DIAGNOSIS — M9902 Segmental and somatic dysfunction of thoracic region: Secondary | ICD-10-CM | POA: Diagnosis not present

## 2023-07-09 DIAGNOSIS — M40202 Unspecified kyphosis, cervical region: Secondary | ICD-10-CM | POA: Diagnosis not present

## 2023-07-09 DIAGNOSIS — M9901 Segmental and somatic dysfunction of cervical region: Secondary | ICD-10-CM | POA: Diagnosis not present

## 2023-07-23 DIAGNOSIS — M9902 Segmental and somatic dysfunction of thoracic region: Secondary | ICD-10-CM | POA: Diagnosis not present

## 2023-07-23 DIAGNOSIS — M40202 Unspecified kyphosis, cervical region: Secondary | ICD-10-CM | POA: Diagnosis not present

## 2023-07-23 DIAGNOSIS — M9907 Segmental and somatic dysfunction of upper extremity: Secondary | ICD-10-CM | POA: Diagnosis not present

## 2023-07-23 DIAGNOSIS — M9901 Segmental and somatic dysfunction of cervical region: Secondary | ICD-10-CM | POA: Diagnosis not present

## 2023-08-06 DIAGNOSIS — M40202 Unspecified kyphosis, cervical region: Secondary | ICD-10-CM | POA: Diagnosis not present

## 2023-08-06 DIAGNOSIS — M9901 Segmental and somatic dysfunction of cervical region: Secondary | ICD-10-CM | POA: Diagnosis not present

## 2023-08-06 DIAGNOSIS — M9902 Segmental and somatic dysfunction of thoracic region: Secondary | ICD-10-CM | POA: Diagnosis not present

## 2023-08-06 DIAGNOSIS — M9907 Segmental and somatic dysfunction of upper extremity: Secondary | ICD-10-CM | POA: Diagnosis not present

## 2024-01-04 ENCOUNTER — Encounter: Payer: Self-pay | Admitting: Podiatry

## 2024-01-04 ENCOUNTER — Ambulatory Visit: Admitting: Podiatry

## 2024-01-04 DIAGNOSIS — L600 Ingrowing nail: Secondary | ICD-10-CM

## 2024-01-04 NOTE — Progress Notes (Signed)
   Chief Complaint  Patient presents with   Nail Problem    "I'm trying to prevent this ingrowing from happening.  My toenail breaks." N - toenail L - hallux bilateral D - 5 years O - gradually worse C - throb, ache, tender, sore, ingrown A - pressure T - none    Subjective: Patient presents today for evaluation of pain to the medial border bilateral great toes. Patient is concerned for possible ingrown nail.  It is very sensitive to touch.  Patient presents today for further treatment and evaluation.  No past medical history on file.  No past surgical history on file.  No Known Allergies  Objective:  General: Well developed, nourished, in no acute distress, alert and oriented x3   Dermatology: Skin is warm, dry and supple bilateral.  Medial border bilateral great toes is tender with evidence of an ingrowing nail. Pain on palpation noted to the border of the nail fold. The remaining nails appear unremarkable at this time.   Vascular: DP and PT pulses palpable.  No clinical evidence of vascular compromise  Neruologic: Grossly intact via light touch bilateral.  Musculoskeletal: No pedal deformity noted  Assesement: #1 Paronychia with ingrowing nail medial border bilateral great toes  Plan of Care:  -Patient evaluated.  -Discussed treatment alternatives and plan of care. Explained nail avulsion procedure and post procedure course to patient. -Patient opted for permanent partial nail avulsion of the ingrown portion of the nail.  -Prior to procedure, local anesthesia infiltration utilized using 3 ml of a 50:50 mixture of 2% plain lidocaine and 0.5% plain marcaine in a normal hallux block fashion and a betadine prep performed.  -Partial permanent nail avulsion with chemical matrixectomy performed using 3x30sec applications of phenol followed by alcohol flush.  -Light dressing applied.  Post care instructions provided -Return to clinic 3 weeks  Dot Gazella, DPM [facility]  Dr. Dot Gazella, DPM    [address] [place], [place] [address]                Office [phone]  Fax [phone]

## 2024-01-04 NOTE — Patient Instructions (Signed)

## 2024-01-25 ENCOUNTER — Ambulatory Visit: Admitting: Podiatry

## 2024-01-25 ENCOUNTER — Encounter: Payer: Self-pay | Admitting: Podiatry

## 2024-01-25 VITALS — Ht 69.0 in | Wt 204.0 lb

## 2024-01-25 DIAGNOSIS — B353 Tinea pedis: Secondary | ICD-10-CM | POA: Diagnosis not present

## 2024-01-25 MED ORDER — CLOTRIMAZOLE-BETAMETHASONE 1-0.05 % EX CREA: 1.0000 | TOPICAL_CREAM | Freq: Every day | CUTANEOUS | 2 refills | Status: AC

## 2024-01-25 MED ORDER — TERBINAFINE HCL 250 MG PO TABS: 250.0000 mg | ORAL_TABLET | Freq: Every day | ORAL | 0 refills | Status: AC

## 2024-01-25 NOTE — Progress Notes (Signed)
   Chief Complaint  Patient presents with   Ingrown Toenail    Pt is here to f/u on ingrown that was removed from left great toenail, pt states the toe is doing fine and has no complaints.    Subjective: 43 y.o. male presents today status post permanent nail avulsion procedure of the medial border bilateral great toes that was performed on 01/04/2024.  Patient doing well.  Significant improvement.  No pain.  Also has a new complaint today regarding peeling with occasional itching to the left foot.  Chronic for several months.  He has not done anything for treatment  No past medical history on file.  Objective: Neurovascular status intact.  Skin is warm, dry and supple. Nail and respective nail fold appears to be healing appropriately.   Diffuse hyperkeratosis of skin noted along the weightbearing surface of the left foot with intermittent pruritus  Assessment: #1 s/p partial permanent nail matrixectomy border bilateral great toes.  01/04/2024 #2 tinea pedis left foot   Plan of care: -Patient was evaluated  -Light debridement of the periungual debris was performed to the border of the respective toe and nail plate using a tissue nipper. -Prescription for Lamisil 250 mg #30 daily.  No history of liver pathology or symptoms.  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 Dot Gazella, DPM    [address] [place], [place] [address]                Office [phone]  Fax [phone]
